# Patient Record
Sex: Female | Born: 1955
Health system: Southern US, Community
[De-identification: ages and names within clinical notes are randomized; demographics above are authoritative.]

## PROBLEM LIST (undated history)

## (undated) DIAGNOSIS — J45909 Unspecified asthma, uncomplicated: Secondary | ICD-10-CM

## (undated) DIAGNOSIS — E119 Type 2 diabetes mellitus without complications: Secondary | ICD-10-CM

## (undated) DIAGNOSIS — E079 Disorder of thyroid, unspecified: Secondary | ICD-10-CM

## (undated) DIAGNOSIS — K819 Cholecystitis, unspecified: Secondary | ICD-10-CM

## (undated) DIAGNOSIS — N200 Calculus of kidney: Secondary | ICD-10-CM

## (undated) DIAGNOSIS — R42 Dizziness and giddiness: Secondary | ICD-10-CM

## (undated) HISTORY — PX: KNEE SURGERY: SHX244

## (undated) HISTORY — PX: CHOLECYSTECTOMY: SHX55

## (undated) HISTORY — PX: ABDOMINAL HYSTERECTOMY: SHX81

---

## 2014-06-11 ENCOUNTER — Encounter (HOSPITAL_BASED_OUTPATIENT_CLINIC_OR_DEPARTMENT_OTHER): Payer: Self-pay

## 2014-06-11 ENCOUNTER — Emergency Department (HOSPITAL_BASED_OUTPATIENT_CLINIC_OR_DEPARTMENT_OTHER)
Admission: EM | Admit: 2014-06-11 | Discharge: 2014-06-11 | Disposition: A | Discharge status: Home / Self Care | Attending: Emergency Medicine | Admitting: Emergency Medicine

## 2014-06-11 ENCOUNTER — Emergency Department (HOSPITAL_BASED_OUTPATIENT_CLINIC_OR_DEPARTMENT_OTHER)

## 2014-06-11 DIAGNOSIS — R1011 Right upper quadrant pain: Secondary | ICD-10-CM | POA: Diagnosis present

## 2014-06-11 DIAGNOSIS — K828 Other specified diseases of gallbladder: Secondary | ICD-10-CM

## 2014-06-11 DIAGNOSIS — R1013 Epigastric pain: Secondary | ICD-10-CM

## 2014-06-11 DIAGNOSIS — E669 Obesity, unspecified: Secondary | ICD-10-CM | POA: Insufficient documentation

## 2014-06-11 DIAGNOSIS — E119 Type 2 diabetes mellitus without complications: Secondary | ICD-10-CM | POA: Diagnosis not present

## 2014-06-11 DIAGNOSIS — R109 Unspecified abdominal pain: Secondary | ICD-10-CM

## 2014-06-11 DIAGNOSIS — E079 Disorder of thyroid, unspecified: Secondary | ICD-10-CM | POA: Diagnosis not present

## 2014-06-11 HISTORY — DX: Type 2 diabetes mellitus without complications: E11.9

## 2014-06-11 HISTORY — DX: Disorder of thyroid, unspecified: E07.9

## 2014-06-11 LAB — URINE MICROSCOPIC-ADD ON

## 2014-06-11 LAB — COMPREHENSIVE METABOLIC PANEL
ALBUMIN: 3.8 g/dL (ref 3.5–5.0)
ALK PHOS: 93 U/L (ref 38–126)
ALT: 24 U/L (ref 14–54)
AST: 22 U/L (ref 15–41)
Anion gap: 9 (ref 5–15)
BUN: 15 mg/dL (ref 6–20)
CALCIUM: 9.1 mg/dL (ref 8.9–10.3)
CHLORIDE: 104 mmol/L (ref 101–111)
CO2: 25 mmol/L (ref 22–32)
CREATININE: 0.67 mg/dL (ref 0.44–1.00)
GFR calc Af Amer: >60 mL/min (ref >60)
GFR calc non Af Amer: >60 mL/min (ref >60)
Glucose, Bld: 157 mg/dL — ABNORMAL HIGH (ref 65–99)
POTASSIUM: 3.3 mmol/L — AB (ref 3.5–5.1)
SODIUM: 138 mmol/L (ref 135–145)
Total Bilirubin: 0.4 mg/dL (ref 0.3–1.2)
Total Protein: 7.5 g/dL (ref 6.5–8.1)

## 2014-06-11 LAB — URINALYSIS, ROUTINE W REFLEX MICROSCOPIC
BILIRUBIN URINE: NEGATIVE
Glucose, UA: NEGATIVE mg/dL
Hgb urine dipstick: NEGATIVE
KETONES UR: NEGATIVE mg/dL
Nitrite: NEGATIVE
PROTEIN: NEGATIVE mg/dL
Specific Gravity, Urine: 1.016 (ref 1.005–1.030)
UROBILINOGEN UA: 0.2 mg/dL (ref 0.0–1.0)
pH: 6 (ref 5.0–8.0)

## 2014-06-11 LAB — LIPASE, BLOOD: LIPASE: 24 U/L (ref 22–51)

## 2014-06-11 LAB — CBC
HEMATOCRIT: 41.7 % (ref 36.0–46.0)
HEMOGLOBIN: 13.8 g/dL (ref 12.0–15.0)
MCH: 27.7 pg (ref 26.0–34.0)
MCHC: 33.1 g/dL (ref 30.0–36.0)
MCV: 83.6 fL (ref 78.0–100.0)
PLATELETS: 295 10*3/uL (ref 150–400)
RBC: 4.99 MIL/uL (ref 3.87–5.11)
RDW: 13.4 % (ref 11.5–15.5)
WBC: 12.1 10*3/uL — ABNORMAL HIGH (ref 4.0–10.5)

## 2014-06-11 MED ORDER — ONDANSETRON 4 MG PO TBDP: ORAL_TABLET | ORAL | Status: DC

## 2014-06-11 MED ORDER — OXYCODONE-ACETAMINOPHEN 5-325 MG PO TABS: 1.0000 | ORAL_TABLET | Freq: Four times a day (QID) | ORAL | Status: AC | PRN

## 2014-06-11 MED ORDER — ONDANSETRON HCL 4 MG/2ML IJ SOLN
4.0000 mg | Freq: Once | INTRAMUSCULAR | Status: AC
  Administered 2014-06-11: 4 mg via INTRAVENOUS
  Filled 2014-06-11: qty 2

## 2014-06-11 MED ORDER — MORPHINE SULFATE 4 MG/ML IJ SOLN
4.0000 mg | Freq: Once | INTRAMUSCULAR | Status: AC
  Administered 2014-06-11: 4 mg via INTRAVENOUS
  Filled 2014-06-11: qty 1

## 2014-06-11 NOTE — ED Notes (Signed)
Patient returns from Ultrasound. 

## 2014-06-11 NOTE — ED Notes (Signed)
MD at bedside. 

## 2014-06-11 NOTE — ED Notes (Addendum)
Pt resting quietly talking with her husband in nad.

## 2014-06-11 NOTE — Discharge Instructions (Signed)
Take Zofran as directed as needed for nausea. Take percocet for severe pain only. No driving or operating heavy machinery while taking percocet. This medication may cause drowsiness. Follow up with the surgeon.  Low-Fat Diet for Pancreatitis or Gallbladder Conditions A low-fat diet can be helpful if you have pancreatitis or a gallbladder condition. With these conditions, your pancreas and gallbladder have trouble digesting fats. A healthy eating plan with less fat will help rest your pancreas and gallbladder and reduce your symptoms. WHAT DO I NEED TO KNOW ABOUT THIS DIET?  Eat a low-fat diet.  Reduce your fat intake to less than 20-30% of your total daily calories. This is less than 50-60 g of fat per day.  Remember that you need some fat in your diet. Ask your dietician what your daily goal should be.  Choose nonfat and low-fat healthy foods. Look for the words "nonfat," "low fat," or "fat free."  As a guide, look on the label and choose foods with less than 3 g of fat per serving. Eat only one serving.  Avoid alcohol.  Do not smoke. If you need help quitting, talk with your health care provider.  Eat small frequent meals instead of three large heavy meals. WHAT FOODS CAN I EAT? Grains Include healthy grains and starches such as potatoes, wheat bread, fiber-rich cereal, and brown rice. Choose whole grain options whenever possible. In adults, whole grains should account for 45-65% of your daily calories.  Fruits and Vegetables Eat plenty of fruits and vegetables. Fresh fruits and vegetables add fiber to your diet. Meats and Other Protein Sources Eat lean meat such as chicken and pork. Trim any fat off of meat before cooking it. Eggs, fish, and beans are other sources of protein. In adults, these foods should account for 10-35% of your daily calories. Dairy Choose low-fat milk and dairy options. Dairy includes fat and protein, as well as calcium.  Fats and Oils Limit high-fat foods  such as fried foods, sweets, baked goods, sugary drinks.  Other Creamy sauces and condiments, such as mayonnaise, can add extra fat. Think about whether or not you need to use them, or use smaller amounts or low fat options. WHAT FOODS ARE NOT RECOMMENDED?  High fat foods, such as:  [employer].  Ice cream.  [place] toast.  Sweet rolls.  Pizza.  Cheese bread.  Foods covered with batter, butter, creamy sauces, or cheese.  Fried foods.  Sugary drinks and desserts.  Foods that cause gas or bloating Document Released: 01/08/2013 Document Reviewed: 01/08/2013 [facility] Patient Information 2015 [place], [place]. This information is not intended to replace advice given to you by your health care provider. Make sure you discuss any questions you have with your health care provider. Cholelithiasis Cholelithiasis (also called gallstones) is a form of gallbladder disease in which gallstones form in your gallbladder. The gallbladder is an organ that stores bile made in the liver, which helps digest fats. Gallstones begin as small crystals and slowly grow into stones. Gallstone pain occurs when the gallbladder spasms and a gallstone is blocking the duct. Pain can also occur when a stone passes out of the duct.  RISK FACTORS  Being female.   Having multiple pregnancies. Health care providers sometimes advise removing diseased gallbladders before future pregnancies.   Being obese.  Eating a diet heavy in fried foods and fat.   Being older than 60 years and increasing age.   Prolonged use of medicines containing female hormones.   Having diabetes mellitus.  Rapidly losing weight.   Having a family history of gallstones (heredity).  SYMPTOMS  Nausea.   Vomiting.  Abdominal pain.   Yellowing of the skin (jaundice).   Sudden pain. It may persist from several minutes to several hours.  Fever.   Tenderness to the touch. In some cases, when gallstones do not move  into the bile duct, people have no pain or symptoms. These are called "silent" gallstones.  TREATMENT Silent gallstones do not need treatment. In severe cases, emergency surgery may be required. Options for treatment include:  Surgery to remove the gallbladder. This is the most common treatment.  Medicines. These do not always work and may take 6-12 months or more to work.  Shock wave treatment (extracorporeal biliary lithotripsy). In this treatment an ultrasound machine sends shock waves to the gallbladder to break gallstones into smaller pieces that can pass into the intestines or be dissolved by medicine. HOME CARE INSTRUCTIONS   Only take over-the-counter or prescription medicines for pain, discomfort, or fever as directed by your health care provider.   Follow a low-fat diet until seen again by your health care provider. Fat causes the gallbladder to contract, which can result in pain.   Follow up with your health care provider as directed. Attacks are almost always recurrent and surgery is usually required for permanent treatment.  SEEK IMMEDIATE MEDICAL CARE IF:   Your pain increases and is not controlled by medicines.   You have a fever or persistent symptoms for more than 2-3 days.   You have a fever and your symptoms suddenly get worse.   You have persistent nausea and vomiting.  MAKE SURE YOU:   Understand these instructions.  Will watch your condition.  Will get help right away if you are not doing well or get worse. Document Released: 12/30/2004 Document Revised: 09/05/2012 Document Reviewed: 06/27/2012 [facility] Patient Information 2015 [place]. This information is not intended to replace advice given to you by your health care provider. Make sure you discuss any questions you have with your health care provider.

## 2014-06-11 NOTE — ED Notes (Addendum)
abd pain, nausea x 3 days-denies v/d-states she was seen at urgent care 2 days ago-felt she had a UTI was advised urine was "clear"-was also tx for ?sciatica with steroid and pain injection

## 2014-06-11 NOTE — ED Provider Notes (Signed)
CSN: [id number]     Arrival date & time 06/11/14  1404 History   First MD Initiated Contact with Patient 06/11/14 1411     Chief Complaint  Patient presents with  . Abdominal Pain     (Consider location/radiation/quality/duration/timing/severity/associated sxs/prior Treatment) HPI Comments: 59 y/o F c/o gradual onset epigastric abdominal pain and nausea x 3 days. Pain is sharp, radiating to both sides of her abdomen, constant, with occasional episodes of more severe pain. Nothing in specific makes the pain come or go. She was seen at urgent care 2 days ago and given a steroid and pain injection with minimal change. Reports she vomited "a little" yesterday and has no appetite. No symptom change with food. Denies fever or CP. Was treated for a UTI in March and April, and at urgent care two days ago was told it was "clear". Denies any urinary symptoms at this time. Hx of abdominal hysterectomy, no other abdominal surgeries.  Patient is a 59 y.o. female presenting with abdominal pain. The history is provided by the patient.  Abdominal Pain Associated symptoms: nausea and vomiting     Past Medical History  Diagnosis Date  . Diabetes mellitus without complication   . Thyroid disease    Past Surgical History  Procedure Laterality Date  . Knee surgery    . Abdominal hysterectomy     No family history on file. History  Substance Use Topics  . Smoking status: Never Smoker   . Smokeless tobacco: Not on file  . Alcohol Use: No   OB History    No data available     Review of Systems  Constitutional: Positive for appetite change.  Gastrointestinal: Positive for nausea, vomiting and abdominal pain.  All other systems reviewed and are negative.     Allergies  Levofloxacin and Zolpidem  Home Medications   Prior to Admission medications   Medication Sig Start Date End Date Taking? Authorizing Provider  Calcium Carbonate-Vitamin D (CALCIUM + D PO) Take by mouth.   Yes Historical  Provider, MD  Levothyroxine Sodium (SYNTHROID PO) Take by mouth.   Yes Historical Provider, MD  METFORMIN HCL PO Take by mouth.   Yes Historical Provider, MD  NABUMETONE PO Take by mouth.   Yes Historical Provider, MD  ondansetron (ZOFRAN ODT) 4 MG disintegrating tablet  ODT q4 hours prn nausea/vomit 06/11/14   Ercole Georg M Tylik Treese, PA-C  oxyCODONE-acetaminophen (PERCOCET) 5-325 MG per tablet Take 1-2 tablets by mouth every 6 (six) hours as needed for severe pain. 06/11/14   Kathrynn Speed, PA-C  TRAMADOL HCL ER PO Take by mouth.   Yes Historical Provider, MD   BP 147/61 mmHg  Pulse 70  Temp(Src) 97.9 F (36.6 C) (Oral)  Resp 18  Ht  (1.626 m)  Wt 223 lb (101.152 kg)  BMI 38.26 kg/m2  SpO2 100% Physical Exam  Constitutional: She is oriented to person, place, and time. She appears well-developed and well-nourished. No distress.  Obese.  HENT:  Head: Normocephalic and atraumatic.  Mouth/Throat: Oropharynx is clear and moist.  Eyes: Conjunctivae and EOM are normal.  Neck: Normal range of motion. Neck supple.  Cardiovascular: Normal rate, regular rhythm and normal heart sounds.   Pulmonary/Chest: Effort normal and breath sounds normal. No respiratory distress.  Abdominal: Normal appearance. There is tenderness in the right upper quadrant, epigastric area and left upper quadrant. There is guarding and positive Murphy's sign. There is no rigidity, no rebound and no CVA tenderness.  No peritoneal signs.  Musculoskeletal: Normal range of motion. She exhibits no edema.  Neurological: She is alert and oriented to person, place, and time. No sensory deficit.  Skin: Skin is warm and dry.  Psychiatric: She has a normal mood and affect. Her behavior is normal.  Nursing note and vitals reviewed.   ED Course  Procedures (including critical care time) Labs Review Labs Reviewed  URINALYSIS, ROUTINE W REFLEX MICROSCOPIC - Abnormal; Notable for the following:    APPearance CLOUDY (*)    Leukocytes,  UA MODERATE (*)    All other components within normal limits  CBC - Abnormal; Notable for the following:    WBC 12.1 (*)    All other components within normal limits  COMPREHENSIVE METABOLIC PANEL - Abnormal; Notable for the following:    Potassium 3.3 (*)    Glucose, Bld 157 (*)    All other components within normal limits  URINE MICROSCOPIC-ADD ON - Abnormal; Notable for the following:    Squamous Epithelial / LPF FEW (*)    Bacteria, UA FEW (*)    All other components within normal limits  LIPASE, BLOOD    Imaging Review [place] Abdomen Complete  06/11/2014   CLINICAL DATA:  Acute epigastric abdominal pain.  EXAM: ULTRASOUND ABDOMEN COMPLETE  COMPARISON:  None.  FINDINGS: This exam is significantly limited due to bowel gas and body habitus.  Gallbladder: Probable sludge is noted within the gallbladder. No gallbladder wall thickening or pericholecystic fluid is noted. No definite stones are noted. Positive sonographic Murphy's sign is noted.  Common bile duct: Diameter: 3 mm which is within normal limits.  Liver: Hepatic parenchyma is coarse and echogenic suggesting diffuse hepatocellular disease. No focal abnormality is noted.  IVC: Not well visualized due to overlying bowel gas.  Pancreas: Visualized portion unremarkable.  Spleen: Appears to be normal in size and contours. Otherwise, not well visualized.  Right Kidney: Length: 12.3 cm. Echogenicity within normal limits. No mass or hydronephrosis visualized.  Left Kidney: Length: 11.4 cm. Echogenicity within normal limits. No mass or hydronephrosis visualized.  Abdominal aorta: No aneurysm visualized.  Other findings: None.  IMPRESSION: Exam is significantly limited due to bowel gas and body habitus. Probable sludge is noted in the gallbladder without cholelithiasis, gallbladder wall thickening or pericholecystic fluid. However, positive sonographic Murphy's sign is noted.  Hepatic parenchyma is coarse and echogenic suggesting diffuse hepatocellular  disease, although no focal abnormality is noted.  No other definite abnormality seen.   Electronically Signed   By: Lupita RaiderJames  Green Jr, M.D.   On: 06/11/2014 15:28     EKG Interpretation None      MDM   Final diagnoses:  Gallbladder sludge  Epigastric abdominal pain  RUQ abdominal pain   NAD. AFVSS. Abdomen soft with no peritoneal signs. Tenderness in RUQ, epigastrium and LUQ with guarding, positive Murphy's sign. Labs, abdominal US pending to evaluate gallbladder.  Leukocytosis of 12.1. Labs without other acute finding. US sig for gallbladder sludge and positive sonographic Murphy's sign. No wall thickening or pericholecystic fluid. No fever. Pain controlled in ED. Repeat abdominal exam with mild RUQ tenderness, significant improvement from initial exam. Pt feels comfortable enough to be d/c home and f/u with gen surgery outpt. Rx percocet and zofran. Stable for d/c. Return precautions given. Patient states understanding of treatment care plan and is agreeable.  Kathrynn SpeedRobyn M Tally Mattox, PA-C 06/11/14 1544  Tilden FossaElizabeth Rees, MD 06/12/14 1057

## 2014-06-11 NOTE — ED Notes (Signed)
Per tech request, pain medication held until after U/S. Pt is aware that she will receive pain medication after U/S.

## 2015-03-04 ENCOUNTER — Emergency Department (HOSPITAL_COMMUNITY)

## 2015-03-04 ENCOUNTER — Emergency Department (HOSPITAL_BASED_OUTPATIENT_CLINIC_OR_DEPARTMENT_OTHER)

## 2015-03-04 ENCOUNTER — Emergency Department (HOSPITAL_BASED_OUTPATIENT_CLINIC_OR_DEPARTMENT_OTHER)
Admission: EM | Admit: 2015-03-04 | Discharge: 2015-03-04 | Disposition: A | Discharge status: Home / Self Care | Attending: Emergency Medicine | Admitting: Emergency Medicine

## 2015-03-04 ENCOUNTER — Encounter (HOSPITAL_BASED_OUTPATIENT_CLINIC_OR_DEPARTMENT_OTHER): Payer: Self-pay | Admitting: *Deleted

## 2015-03-04 DIAGNOSIS — R42 Dizziness and giddiness: Secondary | ICD-10-CM | POA: Insufficient documentation

## 2015-03-04 DIAGNOSIS — R4789 Other speech disturbances: Secondary | ICD-10-CM | POA: Insufficient documentation

## 2015-03-04 DIAGNOSIS — R479 Unspecified speech disturbances: Secondary | ICD-10-CM

## 2015-03-04 DIAGNOSIS — E119 Type 2 diabetes mellitus without complications: Secondary | ICD-10-CM | POA: Diagnosis not present

## 2015-03-04 DIAGNOSIS — R2990 Unspecified symptoms and signs involving the nervous system: Secondary | ICD-10-CM | POA: Diagnosis not present

## 2015-03-04 DIAGNOSIS — R531 Weakness: Secondary | ICD-10-CM | POA: Diagnosis not present

## 2015-03-04 DIAGNOSIS — F447 Conversion disorder with mixed symptom presentation: Secondary | ICD-10-CM | POA: Diagnosis not present

## 2015-03-04 DIAGNOSIS — Z7984 Long term (current) use of oral hypoglycemic drugs: Secondary | ICD-10-CM | POA: Diagnosis not present

## 2015-03-04 LAB — COMPREHENSIVE METABOLIC PANEL
ALBUMIN: 3.8 g/dL (ref 3.5–5.0)
ALT: 32 U/L (ref 14–54)
AST: 35 U/L (ref 15–41)
Alkaline Phosphatase: 99 U/L (ref 38–126)
Anion gap: 9 (ref 5–15)
BILIRUBIN TOTAL: 0.5 mg/dL (ref 0.3–1.2)
BUN: 11 mg/dL (ref 6–20)
CHLORIDE: 103 mmol/L (ref 101–111)
CO2: 27 mmol/L (ref 22–32)
CREATININE: 0.64 mg/dL (ref 0.44–1.00)
Calcium: 9.3 mg/dL (ref 8.9–10.3)
GFR calc Af Amer: >60 mL/min (ref >60)
GFR calc non Af Amer: >60 mL/min (ref >60)
GLUCOSE: 114 mg/dL — AB (ref 65–99)
POTASSIUM: 3.7 mmol/L (ref 3.5–5.1)
Sodium: 139 mmol/L (ref 135–145)
TOTAL PROTEIN: 7.8 g/dL (ref 6.5–8.1)

## 2015-03-04 LAB — DIFFERENTIAL
BASOS ABS: 0 10*3/uL (ref 0.0–0.1)
Basophils Relative: 1 %
EOS ABS: 0.2 10*3/uL (ref 0.0–0.7)
Eosinophils Relative: 2 %
LYMPHS ABS: 3.2 10*3/uL (ref 0.7–4.0)
LYMPHS PCT: 36 %
Monocytes Absolute: 0.8 10*3/uL (ref 0.1–1.0)
Monocytes Relative: 9 %
NEUTROS ABS: 4.5 10*3/uL (ref 1.7–7.7)
NEUTROS PCT: 52 %

## 2015-03-04 LAB — CBC
HCT: 43.5 % (ref 36.0–46.0)
HEMOGLOBIN: 13.9 g/dL (ref 12.0–15.0)
MCH: 27 pg (ref 26.0–34.0)
MCHC: 32 g/dL (ref 30.0–36.0)
MCV: 84.6 fL (ref 78.0–100.0)
Platelets: 285 10*3/uL (ref 150–400)
RBC: 5.14 MIL/uL — AB (ref 3.87–5.11)
RDW: 13.3 % (ref 11.5–15.5)
WBC: 8.7 10*3/uL (ref 4.0–10.5)

## 2015-03-04 LAB — PROTIME-INR
INR: 0.98 (ref 0.00–1.49)
Prothrombin Time: 13.2 seconds (ref 11.6–15.2)

## 2015-03-04 LAB — CBG MONITORING, ED: Glucose-Capillary: 105 mg/dL — ABNORMAL HIGH (ref 65–99)

## 2015-03-04 LAB — TROPONIN I
Troponin I: <0.03 ng/mL (ref <0.031)
Troponin I: <0.03 ng/mL (ref <0.031)

## 2015-03-04 LAB — APTT: APTT: 29 s (ref 24–37)

## 2015-03-04 MED ORDER — MECLIZINE HCL 12.5 MG PO TABS: 12.5000 mg | ORAL_TABLET | Freq: Three times a day (TID) | ORAL | Status: AC | PRN

## 2015-03-04 NOTE — ED Notes (Signed)
Code stroke cancelled per Dr. Manus Gunning

## 2015-03-04 NOTE — ED Notes (Signed)
Patient transferred via EMS to [facility]. Report called to the Charge RN at [facility]. Patient complaining of new onset Headache to her left occipital region of her head.

## 2015-03-04 NOTE — Code Documentation (Signed)
60 year old transferred from [facility] with stroke sx.  On arrival she is alert - speech clear with episodes of slurred speech with poor effort - no weakness noted - smiling and laughing - states she had headache intermittently - no focal neuro deficits noted except speech which she describes as " i think it is going to happen again"  Also states she had sx around 4 pm at home today.  Dr. Lavon Paganini at bedside.  VSS.  CBG 112.   Transported to MRI - negative DWI per Dr. Lelon Huh to RN [facility] in ED.  Code stroke cancelled per Dr. Lavon Paganini.

## 2015-03-04 NOTE — ED Notes (Addendum)
Pt amb to triage room 1, resp rapid and shallow, encouraged to slow her resp. reports dizzy x 30 minutes, states she is having trouble speaking, speech is clear after pt encouraged to slow her respirations. Pt states "I'm scared I'm having a stroke!" smile is symetrical, moe + x 4 ext. At triage.

## 2015-03-04 NOTE — ED Notes (Signed)
Per EMS - initially slurring speech at [facility]. Pt had no slurring or deficits en route to [facility]. Negative stroke scale en route. Pt states "I can tell when the slurred speech is coming back again."

## 2015-03-04 NOTE — Discharge Instructions (Signed)
Dizziness Your testing is negative for stroke. Follow up with the neurologist. Return to the ED if you develop new or worsening symptoms. Dizziness is a common problem. It is a feeling of unsteadiness or light-headedness. You may feel like you are about to faint. Dizziness can lead to injury if you stumble or fall. Anyone can become dizzy, but dizziness is more common in older adults. This condition can be caused by a number of things, including medicines, dehydration, or illness. HOME CARE INSTRUCTIONS Taking these steps may help with your condition: Eating and Drinking  Drink enough fluid to keep your urine clear or pale yellow. This helps to keep you from becoming dehydrated. Try to drink more clear fluids, such as water.  Do not drink alcohol.  Limit your caffeine intake if directed by your health care provider.  Limit your salt intake if directed by your health care provider. Activity  Avoid making quick movements.  Rise slowly from chairs and steady yourself until you feel okay.  In the morning, first sit up on the side of the bed. When you feel okay, stand slowly while you hold onto something until you know that your balance is fine.  Move your legs often if you need to stand in one place for a long time. Tighten and relax your muscles in your legs while you are standing.  Do not drive or operate heavy machinery if you feel dizzy.  Avoid bending down if you feel dizzy. Place items in your home so that they are easy for you to reach without leaning over. Lifestyle  Do not use any tobacco products, including cigarettes, chewing tobacco, or electronic cigarettes. If you need help quitting, ask your health care provider.  Try to reduce your stress level, such as with yoga or meditation. Talk with your health care provider if you need help. General Instructions  Watch your dizziness for any changes.  Take medicines only as directed by your health care provider. Talk with your  health care provider if you think that your dizziness is caused by a medicine that you are taking.  Tell a friend or a family member that you are feeling dizzy. If he or she notices any changes in your behavior, have this person call your health care provider.  Keep all follow-up visits as directed by your health care provider. This is important. SEEK MEDICAL CARE IF:  Your dizziness does not go away.  Your dizziness or light-headedness gets worse.  You feel nauseous.  You have reduced hearing.  You have new symptoms.  You are unsteady on your feet or you feel like the room is spinning. SEEK IMMEDIATE MEDICAL CARE IF:  You vomit or have diarrhea and are unable to eat or drink anything.  You have problems talking, walking, swallowing, or using your arms, hands, or legs.  You feel generally weak.  You are not thinking clearly or you have trouble forming sentences. It may take a friend or family member to notice this.  You have chest pain, abdominal pain, shortness of breath, or sweating.  Your vision changes.  You notice any bleeding.  You have a headache.  You have neck pain or a stiff neck.  You have a fever.   This information is not intended to replace advice given to you by your health care provider. Make sure you discuss any questions you have with your health care provider.   Document Released: 06/29/2000 Document Revised: 05/20/2014 Document Reviewed: 12/30/2013 Elsevier Interactive Patient Education  2016 [employer] ° °

## 2015-03-04 NOTE — ED Notes (Signed)
Pt arrives by [facility]. Intermittent slurring of speech and difficulty forming/communicating thoughts. Mild left-sided arm and leg weakness initially, pt now able to lift bilateral arms and legs w/o difficulty.

## 2015-03-04 NOTE — ED Notes (Addendum)
Pt ambulated in room without difficulty. States that she walked to the bathroom recently and had no difficulties. Pt drinking water without difficulty.

## 2015-03-04 NOTE — ED Provider Notes (Signed)
CSN: [id number]     Arrival date & time 03/04/15  1741 History   First MD Initiated Contact with Patient 03/04/15 1758     Chief Complaint  Patient presents with  . Code Stroke  . Dizziness    @ (Consider location/radiation/quality/duration/timing/severity/associated sxs/prior Treatment) HPI Patient presents with acute onset room spinning sensation associated with nausea starting at 5:15. Patient states she laid down and see if the symptoms would resolve. She noted that every time she moved the dizziness worsened. She then began having difficulty speaking. Brought into the emergency department by her husband. She is now complaining of left-sided weakness and numbness. This is started such she arrived in the emergency department. No previous history of stroke. Denies headache, chest pain, abdominal pain. No recent upper respiratory infection or illness Past Medical History  Diagnosis Date  . Diabetes mellitus without complication (HCC)   . Thyroid disease    Past Surgical History  Procedure Laterality Date  . Knee surgery    . Abdominal hysterectomy     History reviewed. No pertinent family history. Social History  Substance Use Topics  . Smoking status: Never Smoker   . Smokeless tobacco: None  . Alcohol Use: No   OB History    No data available     Review of Systems  Constitutional: Negative for fever and chills.  HENT: Negative for congestion, ear pain and sinus pressure.   Eyes: Negative for visual disturbance.  Respiratory: Negative for cough and shortness of breath.   Cardiovascular: Negative for chest pain.  Gastrointestinal: Positive for nausea. Negative for vomiting and abdominal pain.  Musculoskeletal: Positive for gait problem. Negative for myalgias, back pain, neck pain and neck stiffness.  Skin: Negative for rash and wound.  Neurological: Positive for dizziness, speech difficulty, weakness, light-headedness and numbness. Negative for headaches.  All  other systems reviewed and are negative.     Allergies  Levofloxacin and Zolpidem  Home Medications   Prior to Admission medications   Medication Sig Start Date End Date Taking? Authorizing Provider  Calcium Carbonate-Vitamin D (CALCIUM + D PO) Take by mouth.    Historical Provider, MD  Levothyroxine Sodium (SYNTHROID PO) Take by mouth.    Historical Provider, MD  METFORMIN HCL PO Take by mouth.    Historical Provider, MD  NABUMETONE PO Take by mouth.    Historical Provider, MD  ondansetron (ZOFRAN ODT) 4 MG disintegrating tablet  ODT q4 hours prn nausea/vomit 06/11/14   Robyn M Hess, PA-C  oxyCODONE-acetaminophen (PERCOCET) 5-325 MG per tablet Take 1-2 tablets by mouth every 6 (six) hours as needed for severe pain. 06/11/14   Kathrynn Speed, PA-C  TRAMADOL HCL ER PO Take by mouth.    Historical Provider, MD   BP 168/88 mmHg  Pulse 83  Temp(Src) 97.8 F (36.6 C) (Oral)  Resp 13  SpO2 99% Physical Exam  Constitutional: She is oriented to person, place, and time. She appears well-developed and well-nourished. No distress.  Anxious appearing  HENT:  Head: Normocephalic and atraumatic.  Mouth/Throat: Oropharynx is clear and moist. No oropharyngeal exudate.  Eyes: EOM are normal. Pupils are equal, round, and reactive to light.  Fatigable rotatory nystagmus worsened with position change  Neck: Normal range of motion. Neck supple.  Cardiovascular: Normal rate and regular rhythm.  Exam reveals no gallop and no friction rub.   No murmur heard. Pulmonary/Chest: Effort normal and breath sounds normal. No respiratory distress. She has no wheezes. She has no rales. She exhibits  no tenderness.  Abdominal: Soft. Bowel sounds are normal. She exhibits no distension. There is no tenderness. There is no rebound and no guarding.  Musculoskeletal: Normal range of motion. She exhibits no edema or tenderness.  Lymphadenopathy:    She has no cervical adenopathy.  Neurological: She is alert and  oriented to person, place, and time.  Patient with inconsistent expressive aphasia. No facial asymmetry. Patient states she is having decreased sensation to the left side of upper and lower face. She also states she's having decreased sensation left upper and lower extremity. 4/5 motor in left upper extremity. 3/5 motor in left lower extremity. This appears to be effort dependent. 5/5 motor and right upper and right lower extremities.  Skin: Skin is warm and dry. No rash noted. No erythema.  Nursing note and vitals reviewed.   ED Course  Procedures (including critical care time) Labs Review Labs Reviewed  CBC - Abnormal; Notable for the following:    RBC 5.14 (*)    All other components within normal limits  CBG MONITORING, ED - Abnormal; Notable for the following:    Glucose-Capillary 105 (*)    All other components within normal limits  PROTIME-INR  APTT  DIFFERENTIAL  COMPREHENSIVE METABOLIC PANEL  TROPONIN I  CBG MONITORING, ED    Imaging Review Ct Head Wo Contrast  03/04/2015  CLINICAL DATA:  60 year old female with aphasia and dizziness. EXAM: CT HEAD WITHOUT CONTRAST TECHNIQUE: Contiguous axial images were obtained from the base of the skull through the vertex without intravenous contrast. COMPARISON:  None. FINDINGS: No acute intracranial hemorrhage. The ventricles and sulci are appropriate in size for patient's age. Minimal periventricular and white matter hypodensities represent chronic microvascular ischemic changes. Stop there is no mass effect or midline shift. An empty sella is incidentally noted. The visualized paranasal sinuses and mastoid air cells are well aerated. The calvarium is intact. IMPRESSION: No acute intracranial pathology. Electronically Signed   By: Elgie Collard M.D.   On: 03/04/2015 18:21   I have personally reviewed and evaluated these images and lab results as part of my medical decision-making.   EKG Interpretation   Date/Time:  Wednesday  March 04 2015 17:56:14 EST Ventricular Rate:  80 PR Interval:  136 QRS Duration: 85 QT Interval:  379 QTC Calculation: 437 R Axis:   31 Text Interpretation:  Sinus rhythm Abnormal R-wave progression, early  transition Confirmed by Ranae Palms  MD, Aydn Ferrara ([id number]) on 03/04/2015 6:28:07  PM      MDM   Final diagnoses:  Vertigo  Spell of change in speech  Left-sided weakness    Discussed with neurology on call. Will see patient in the [facility] emergency department. Dr. Anitra Lauth will accept patient. Patient had CT head performed in the emergency department. No acute intracranial bleed identified.    Loren Racer, MD 03/04/15 [phone]

## 2015-03-04 NOTE — ED Notes (Signed)
Rapid Response RN went with patient to MRI

## 2015-03-04 NOTE — ED Notes (Signed)
Patient transported to CT with RN 

## 2015-03-04 NOTE — Consult Note (Signed)
Requesting Physician: Dr. Manus Gunning     Reason for consultation: to evaluate for acute stroke, left-sided weakness  HPI:                                                                                                                                         Denise Gallagher is an 60 y.o. female patient who presented  to the [facility] ER with symptoms of vertigo, headache neck pain and left-sided weakness. Due to neurological symptoms, an acute stroke was suspected and she was urgently transferred to [facility] ER for further evaluation. A stroke code was called and when she came into the emergency room .   patient reports that her symptoms have been fluctuating and now after she presented to the ER for left-sided weakness symptoms have resolved. Her headache and neck pain also been improving. She intermittently have some stuttering speech. Denies prior head trauma or prior strokes. History of diabetes and hypothyroidism. He is not taking aspirin consistently  Date last known well:  03/04/15 Time last known well:  1700 tPA Given: No: Nonfocal examination , suspect conversion disorder, negative MRI for acute stroke   Stroke Risk Factors - diabetes mellitus  Past Medical History: Past Medical History  Diagnosis Date  . Diabetes mellitus without complication (HCC)   . Thyroid disease     Past Surgical History  Procedure Laterality Date  . Knee surgery    . Abdominal hysterectomy      Family History: History reviewed. No pertinent family history.  Social History:   reports that she has never smoked. She does not have any smokeless tobacco history on file. She reports that she does not drink alcohol or use illicit drugs.  Allergies:  Allergies  Allergen Reactions  . Levofloxacin   . Zolpidem      Medications:                                                                                                                        No current facility-administered medications for this  encounter.  Current outpatient prescriptions:  .  Calcium Carbonate-Vitamin D (CALCIUM + D PO), Take by mouth., Disp: , Rfl:  .  Levothyroxine Sodium (SYNTHROID PO), Take by mouth., Disp: , Rfl:  .  METFORMIN HCL PO, Take by mouth., Disp: , Rfl:  .  NABUMETONE PO, Take by mouth., Disp: , Rfl:  .  ondansetron (ZOFRAN ODT) 4 MG disintegrating tablet,  ODT q4 hours prn nausea/vomit, Disp: 10 tablet, Rfl: 0 .  oxyCODONE-acetaminophen (PERCOCET) 5-325 MG per tablet, Take 1-2 tablets by mouth every 6 (six) hours as needed for severe pain., Disp: 30 tablet, Rfl: 0 .  TRAMADOL HCL ER PO, Take by mouth., Disp: , Rfl:    ROS:                                                                                                                                       History obtained from the patient  General ROS: negative for - chills, fatigue, fever, night sweats, weight gain or weight loss Psychological ROS: negative for - behavioral disorder, hallucinations, memory difficulties, mood swings or suicidal ideation Ophthalmic ROS: negative for - blurry vision, double vision, eye pain or loss of vision ENT ROS: negative for - epistaxis, nasal discharge, oral lesions, sore throat, tinnitus or vertigo Allergy and Immunology ROS: negative for - hives or itchy/watery eyes Hematological and Lymphatic ROS: negative for - bleeding problems, bruising or swollen lymph nodes Endocrine ROS: negative for - galactorrhea, hair pattern changes, polydipsia/polyuria or temperature intolerance Respiratory ROS: negative for - cough, hemoptysis, shortness of breath or wheezing Cardiovascular ROS: negative for - chest pain, dyspnea on exertion, edema or irregular heartbeat Gastrointestinal ROS: negative for - abdominal pain, diarrhea, hematemesis, nausea/vomiting or stool incontinence Genito-Urinary ROS: negative for - dysuria, hematuria, incontinence or urinary frequency/urgency Musculoskeletal ROS: negative for - joint  swelling or muscular weakness Neurological ROS: as noted in HPI Dermatological ROS: negative for rash and skin lesion changes  Neurologic Examination:                                                                                                      Blood pressure 168/88, pulse 83, temperature 97.8 F (36.6 C), temperature source Oral, resp. rate 13, SpO2 99 %.  Evaluation of higher integrative functions including: Level of alertness: Alert,  not in acute distress  Oriented to time, place and person Speech: fluent, no evidence of dysarthria or aphasia noted.  she had some intermittent stuttering speech which appears psychogenic, but mostly fluent speech Test the following cranial nerves: 2-12 grossly intact Motor examination: Normal tone, bulk, full 5/5 motor strength in all 4 extremities. Initially had some giveaway on the left side which improved to full strength on encouragement  Examination of sensation : Normal and symmetric sensation to pinprick in all 4 extremities and on face. She initially  reported reduced sensation on the left side but with repeated testing, she reported symmetric sensation bilaterally .  Examination of deep tendon reflexes: 2+, normal and symmetric in all extremities, normal plantars bilaterally Test coordination: Normal finger nose testing, with no evidence of limb appendicular ataxia or abnormal involuntary movements or tremors noted.  Gait: Deferred   Lab Results: Basic Metabolic Panel:  Recent Labs Lab 03/04/15 1800  NA 139  K 3.7  CL 103  CO2 27  GLUCOSE 114*  BUN 11  CREATININE 0.64  CALCIUM 9.3    Liver Function Tests:  Recent Labs Lab 03/04/15 1800  AST 35  ALT 32  ALKPHOS 99  BILITOT 0.5  PROT 7.8  ALBUMIN 3.8   No results for input(s): LIPASE, AMYLASE in the last 168 hours. No results for input(s): AMMONIA in the last 168 hours.  CBC:  Recent Labs Lab 03/04/15 1800  WBC 8.7  NEUTROABS 4.5  HGB 13.9  HCT 43.5  MCV 84.6   PLT 285    Cardiac Enzymes:  Recent Labs Lab 03/04/15 1800  TROPONINI <0.03    Lipid Panel: No results for input(s): CHOL, TRIG, HDL, CHOLHDL, VLDL, LDLCALC in the last 168 hours.  CBG:  Recent Labs Lab 03/04/15 1745  GLUCAP 105*    Microbiology: No results found for this or any previous visit.   Imaging: Ct Head Wo Contrast  03/04/2015  CLINICAL DATA:  60 year old female with aphasia and dizziness. EXAM: CT HEAD WITHOUT CONTRAST TECHNIQUE: Contiguous axial images were obtained from the base of the skull through the vertex without intravenous contrast. COMPARISON:  None. FINDINGS: No acute intracranial hemorrhage. The ventricles and sulci are appropriate in size for patient's age. Minimal periventricular and white matter hypodensities represent chronic microvascular ischemic changes. Stop there is no mass effect or midline shift. An empty sella is incidentally noted. The visualized paranasal sinuses and mastoid air cells are well aerated. The calvarium is intact. IMPRESSION: No acute intracranial pathology. Electronically Signed   By: Elgie Collard M.D.   On: 03/04/2015 18:21    Assessment and plan:   Waverley Krempasky is an 60 y.o. female patient who was brought into the [facility] ER for further evaluation of acute stroke like symptoms. Neurological examination is suggestive for possible conversion disorder with no definite focal neurological deficits noted on exam. Hence stat MRI was done to rule out an acute stroke given the suspicion for conversion syndrome. The MRI study showed no acute stroke already W sequence. At the time of completing this note, other sequences were not completed and I haven't reviewed them.  Since her clinical examination is nonfocal, with negative DWI MRI sequence, her presentation is not consistent with an acute stroke. Likely conversion disorder.  No further acute neurodiagnostic workup recommended at this time from neurological standpoint. Supportive care  and medical management deferred to the ER physician.   Discussed with ER physician, Dr. Manus Gunning.

## 2015-03-04 NOTE — ED Provider Notes (Signed)
Patient transferred from outside facility with room spinning sensation. She also has difficulty getting her words out. Reported to have left-sided weakness and numbness at the outside facility.  She is now able to lift her bilateral arms and legs without difficulty. She has slurred speech that comes and goes and some stuttering.  Seen with neurology on arrival. Anxious appearing, stuttering, intermittent, expressive aphasia. CN 2-12 intact. Moving all extremities.  MRI Negative for stroke or other acute abnormality. Tolerating PO and ambulatory.  No further neurological workup recommended per Dr. Lazaro Arms, MD 03/04/15 [id number]

## 2015-12-05 ENCOUNTER — Emergency Department (HOSPITAL_BASED_OUTPATIENT_CLINIC_OR_DEPARTMENT_OTHER)

## 2015-12-05 ENCOUNTER — Encounter (HOSPITAL_BASED_OUTPATIENT_CLINIC_OR_DEPARTMENT_OTHER): Payer: Self-pay | Admitting: Emergency Medicine

## 2015-12-05 ENCOUNTER — Emergency Department (HOSPITAL_BASED_OUTPATIENT_CLINIC_OR_DEPARTMENT_OTHER)
Admission: EM | Admit: 2015-12-05 | Discharge: 2015-12-05 | Disposition: A | Discharge status: Home / Self Care | Attending: Emergency Medicine | Admitting: Emergency Medicine

## 2015-12-05 DIAGNOSIS — Z7984 Long term (current) use of oral hypoglycemic drugs: Secondary | ICD-10-CM | POA: Insufficient documentation

## 2015-12-05 DIAGNOSIS — Z79899 Other long term (current) drug therapy: Secondary | ICD-10-CM | POA: Diagnosis not present

## 2015-12-05 DIAGNOSIS — E119 Type 2 diabetes mellitus without complications: Secondary | ICD-10-CM | POA: Diagnosis not present

## 2015-12-05 DIAGNOSIS — B9789 Other viral agents as the cause of diseases classified elsewhere: Secondary | ICD-10-CM

## 2015-12-05 DIAGNOSIS — J069 Acute upper respiratory infection, unspecified: Secondary | ICD-10-CM | POA: Diagnosis not present

## 2015-12-05 DIAGNOSIS — R05 Cough: Secondary | ICD-10-CM | POA: Diagnosis present

## 2015-12-05 DIAGNOSIS — J45909 Unspecified asthma, uncomplicated: Secondary | ICD-10-CM | POA: Insufficient documentation

## 2015-12-05 DIAGNOSIS — R1110 Vomiting, unspecified: Secondary | ICD-10-CM | POA: Insufficient documentation

## 2015-12-05 HISTORY — DX: Calculus of kidney: N20.0

## 2015-12-05 HISTORY — DX: Cholecystitis, unspecified: K81.9

## 2015-12-05 HISTORY — DX: Unspecified asthma, uncomplicated: J45.909

## 2015-12-05 MED ORDER — OXYMETAZOLINE HCL 0.05 % NA SOLN
1.0000 | Freq: Two times a day (BID) | NASAL | Status: DC | PRN
  Administered 2015-12-05: 1 via NASAL
  Filled 2015-12-05: qty 15

## 2015-12-05 MED ORDER — BENZONATATE 100 MG PO CAPS: 100.0000 mg | ORAL_CAPSULE | Freq: Three times a day (TID) | ORAL | 0 refills | Status: DC

## 2015-12-05 NOTE — ED Notes (Signed)
States has had sinus drainage and pain to face since Thursday, with coughing green mucus.

## 2015-12-05 NOTE — ED Triage Notes (Signed)
Pt c/o productive cough with green sputum since Wednesday along with sore throat that started yesterday.

## 2015-12-05 NOTE — ED Provider Notes (Signed)
[facility] Provider Note   CSN: [id number] Arrival date & time: 12/05/15  1339  By signing my name below, I, Denise Gallagher, attest that this documentation has been prepared under the direction and in the presence of Rolan BuccoMelanie Thai Burgueno, MD . Electronically Signed: Modena JanskyAlbert Gallagher, Scribe. 12/05/2015. 3:37 PM.  History   Chief Complaint Chief Complaint  Patient presents with  . Cough   The history is provided by the patient. No language interpreter was used.   HPI Comments: Denise Gallagher is a 60 y.o. female with a hx of asthma who presents to the Emergency Department complaining of intermittent moderate cough that started about 3 days ago. She states she has been having URI-like symptoms that have been gradually worsening. She reports associated symptoms of nasal congestion, sinus pressure, subjective fever, and post-tussive vomiting. Pt's temperature in the ED today was 98.3. She has been taking mucinex for her symptoms with minimal relief. She denies any decreased fluid intake, rash, or abdominal pain.   PCP: Dr. Lafayette Dragonarr  Past Medical History:  Diagnosis Date  . Asthma   . Diabetes mellitus without complication (HCC)   . Gall bladder inflammation   . Kidney stone   . Thyroid disease     There are no active problems to display for this patient.   Past Surgical History:  Procedure Laterality Date  . ABDOMINAL HYSTERECTOMY    . KNEE SURGERY      OB History    No data available       Home Medications    Prior to Admission medications   Medication Sig Start Date End Date Taking? Authorizing Provider  Ascorbic Acid (VITAMIN C CR) 500 MG CPCR Take 500 mg by mouth daily. 03/15/13  Yes Historical Provider, MD  Calcium Citrate-Vitamin D (CALCIUM + D PO) Take 1 tablet by mouth 4 (four) times a week.   Yes Historical Provider, MD  Cholecalciferol (D 2000) 2000 units TABS Take 2,000 Units by mouth daily. 03/15/13  Yes Historical Provider, MD  levothyroxine (SYNTHROID,  LEVOTHROID) 25 MCG tablet Take 25 mcg by mouth daily before breakfast. 01/07/15  Yes Historical Provider, MD  metFORMIN (GLUCOPHAGE-XR) 500 MG 24 hr tablet Take 500 mg by mouth 2 (two) times daily. 01/07/15  Yes Historical Provider, MD  PROAIR HFA 108 (90 Base) MCG/ACT inhaler Inhale 2 puffs into the lungs daily as needed for shortness of breath.  01/18/15  Yes Historical Provider, MD  traMADol (ULTRAM) 50 MG tablet Take 50 mg by mouth every morning. For cough 02/17/15  Yes Historical Provider, MD  benzonatate (TESSALON) 100 MG capsule Take 1 capsule (100 mg total) by mouth every 8 (eight) hours. 12/05/15   Rolan BuccoMelanie Keyshawna Prouse, MD  meclizine (ANTIVERT) 12.5 MG tablet Take 1 tablet (12.5 mg total) by mouth 3 (three) times daily as needed for dizziness. 03/04/15   Glynn OctaveStephen Rancour, MD  omeprazole (PRILOSEC) 40 MG capsule Take 40 mg by mouth daily. 11/27/14   Historical Provider, MD  ondansetron (ZOFRAN ODT) 4 MG disintegrating tablet 4mg  ODT q4 hours prn nausea/vomit 06/11/14   Robyn M Hess, PA-C  oxyCODONE-acetaminophen (PERCOCET) 5-325 MG per tablet Take 1-2 tablets by mouth every 6 (six) hours as needed for severe pain. 06/11/14   Kathrynn Speedobyn M Hess, PA-C  tamsulosin (FLOMAX) 0.4 MG CAPS capsule Take 0.4 mg by mouth daily as needed (for urine flow).  12/25/14   Historical Provider, MD    Family History No family history on file.  Social History Social History  Substance  Use Topics  . Smoking status: Never Smoker  . Smokeless tobacco: Never Used  . Alcohol use No     Allergies   Levofloxacin and Zolpidem tartrate   Review of Systems Review of Systems  Constitutional: Positive for fever (Subjective). Negative for chills, diaphoresis and fatigue.  HENT: Positive for congestion (Nasal ) and sinus pressure. Negative for rhinorrhea and sneezing.   Eyes: Negative.   Respiratory: Positive for cough. Negative for chest tightness and shortness of breath.   Cardiovascular: Negative for chest pain and leg  swelling.  Gastrointestinal: Positive for vomiting (Post-tussive). Negative for abdominal pain, blood in stool, diarrhea and nausea.  Genitourinary: Negative for difficulty urinating, flank pain, frequency and hematuria.  Musculoskeletal: Negative for arthralgias and back pain.  Skin: Negative for rash.  Neurological: Negative for dizziness, speech difficulty, weakness, numbness and headaches.     Physical Exam Updated Vital Signs BP 131/70 (BP Location: Right Arm)   Pulse 93   Temp 98.3 F (36.8 C) (Oral)   Resp 20   Ht 5\' 4"  (1.626 m)   Wt 218 lb (98.9 kg)   SpO2 99%   BMI 37.42 kg/m   Physical Exam  Constitutional: She is oriented to person, place, and time. She appears well-developed and well-nourished.  HENT:  Head: Normocephalic and atraumatic.  Right Ear: Tympanic membrane normal.  Left Ear: Tympanic membrane normal.  Mouth/Throat: Uvula is midline and oropharynx is clear and moist. No oropharyngeal exudate, posterior oropharyngeal edema, posterior oropharyngeal erythema or tonsillar abscesses.  Eyes: Pupils are equal, round, and reactive to light.  Neck: Normal range of motion. Neck supple.  Cardiovascular: Normal rate, regular rhythm and normal heart sounds.   Pulmonary/Chest: Effort normal and breath sounds normal. No respiratory distress. She has no wheezes. She has no rales. She exhibits no tenderness.  Abdominal: Soft. Bowel sounds are normal. There is no tenderness. There is no rebound and no guarding.  Musculoskeletal: Normal range of motion. She exhibits no edema.  Lymphadenopathy:    She has no cervical adenopathy.  Neurological: She is alert and oriented to person, place, and time.  Skin: Skin is warm and dry. No rash noted.  Psychiatric: She has a normal mood and affect.  Nursing note and vitals reviewed.    ED Treatments / Results  DIAGNOSTIC STUDIES: Oxygen Saturation is 99% on RA, normal by my interpretation.    COORDINATION OF CARE: 3:41 PM- Pt  advised of plan for treatment and pt agrees.  Labs (all labs ordered are listed, but only abnormal results are displayed) Labs Reviewed - No data to display  EKG  EKG Interpretation None       Radiology Dg Chest 2 View  Result Date: 12/05/2015 CLINICAL DATA:  Cough for 4 days EXAM: CHEST  2 VIEW COMPARISON:  None. FINDINGS: Lungs are clear. Heart size and pulmonary vascularity are normal. No adenopathy. There is mild degenerative change in the thoracic spine. IMPRESSION: No edema or consolidation. Electronically Signed   By: Bretta Bang III M.D.   On: 12/05/2015 15:25    Procedures Procedures (including critical care time)  Medications Ordered in ED Medications  oxymetazoline (AFRIN) 0.05 % nasal spray 1 spray (not administered)     Initial Impression / Assessment and Plan / ED Course  I have reviewed the triage vital signs and the nursing notes.  Pertinent labs & imaging results that were available during my care of the patient were reviewed by me and considered in my medical decision making (  see chart for details).  Clinical Course     Patient presents with cough and upper respiratory symptoms. Given the symptoms have only been going on for 3 days and she's otherwise well-appearing, I don't suspect a bacterial sinus infection. There is no evidence of pneumonia. No other evidence of bacterial infection. She was discharged home in good condition. She is currently taking a codeine-based cough syrup but she says she doesn't like the way it makes her feel. I gave her a prescription for Tessalon Perles and she was also dispensed Afrin to use for the sinus congestion. She will continue Mucinex as well. She was advised only to use the Afrin for 3 days and discontinue it after that. She was advised to follow[place] if her symptoms are not improving or return here as needed for any worsening symptoms.  Final Clinical Impressions(s) / ED Diagnoses     Final diagnoses:  Viral URI with cough    New Prescriptions New Prescriptions   BENZONATATE (TESSALON) 100 MG CAPSULE    Take 1 capsule (100 mg total) by mouth every 8 (eight) hours.   I personally performed the services described in this documentation, which was scribed in my presence.  The recorded information has been reviewed and considered.     Rolan BuccoMelanie Harjit Douds, MD 12/05/15 [phone]

## 2015-12-05 NOTE — ED Notes (Signed)
Patient denies pain and is resting comfortably.  

## 2015-12-08 ENCOUNTER — Encounter (HOSPITAL_BASED_OUTPATIENT_CLINIC_OR_DEPARTMENT_OTHER): Payer: Self-pay | Admitting: Emergency Medicine

## 2015-12-08 ENCOUNTER — Emergency Department (HOSPITAL_BASED_OUTPATIENT_CLINIC_OR_DEPARTMENT_OTHER)

## 2015-12-08 ENCOUNTER — Emergency Department (HOSPITAL_BASED_OUTPATIENT_CLINIC_OR_DEPARTMENT_OTHER)
Admission: EM | Admit: 2015-12-08 | Discharge: 2015-12-08 | Disposition: A | Discharge status: Home / Self Care | Attending: Emergency Medicine | Admitting: Emergency Medicine

## 2015-12-08 DIAGNOSIS — E119 Type 2 diabetes mellitus without complications: Secondary | ICD-10-CM | POA: Insufficient documentation

## 2015-12-08 DIAGNOSIS — Y999 Unspecified external cause status: Secondary | ICD-10-CM | POA: Diagnosis not present

## 2015-12-08 DIAGNOSIS — S6992XA Unspecified injury of left wrist, hand and finger(s), initial encounter: Secondary | ICD-10-CM | POA: Diagnosis present

## 2015-12-08 DIAGNOSIS — M25562 Pain in left knee: Secondary | ICD-10-CM | POA: Insufficient documentation

## 2015-12-08 DIAGNOSIS — Y9301 Activity, walking, marching and hiking: Secondary | ICD-10-CM | POA: Insufficient documentation

## 2015-12-08 DIAGNOSIS — J45909 Unspecified asthma, uncomplicated: Secondary | ICD-10-CM | POA: Diagnosis not present

## 2015-12-08 DIAGNOSIS — Z79899 Other long term (current) drug therapy: Secondary | ICD-10-CM | POA: Insufficient documentation

## 2015-12-08 DIAGNOSIS — Y929 Unspecified place or not applicable: Secondary | ICD-10-CM | POA: Diagnosis not present

## 2015-12-08 DIAGNOSIS — Z7984 Long term (current) use of oral hypoglycemic drugs: Secondary | ICD-10-CM | POA: Insufficient documentation

## 2015-12-08 DIAGNOSIS — W1839XA Other fall on same level, initial encounter: Secondary | ICD-10-CM | POA: Diagnosis not present

## 2015-12-08 DIAGNOSIS — S6292XA Unspecified fracture of left wrist and hand, initial encounter for closed fracture: Secondary | ICD-10-CM

## 2015-12-08 DIAGNOSIS — S62617A Displaced fracture of proximal phalanx of left little finger, initial encounter for closed fracture: Secondary | ICD-10-CM | POA: Diagnosis not present

## 2015-12-08 DIAGNOSIS — W19XXXA Unspecified fall, initial encounter: Secondary | ICD-10-CM

## 2015-12-08 HISTORY — DX: Dizziness and giddiness: R42

## 2015-12-08 MED ORDER — HYDROCODONE-ACETAMINOPHEN 5-325 MG PO TABS: 2.0000 | ORAL_TABLET | ORAL | 0 refills | Status: AC | PRN

## 2015-12-08 MED ORDER — HYDROCODONE-ACETAMINOPHEN 5-325 MG PO TABS
1.0000 | ORAL_TABLET | Freq: Once | ORAL | Status: AC
  Administered 2015-12-08: 1 via ORAL
  Filled 2015-12-08: qty 1

## 2015-12-08 MED ORDER — DOCUSATE SODIUM 100 MG PO CAPS: 100.0000 mg | ORAL_CAPSULE | Freq: Two times a day (BID) | ORAL | 0 refills | Status: AC

## 2015-12-08 NOTE — Discharge Instructions (Signed)
You were seen in the ED today with a hand fracture. We are placing you in a splint that you will need to keep clean and dry. Take pain medication as needed but do not drive while taking this medication.   Follow up with the  orthopedic surgeon listed below for outpatient follow up in the next week as this type of fracture sometimes requires surgery.

## 2015-12-08 NOTE — ED Provider Notes (Signed)
Emergency Department Provider Note   I have reviewed the triage vital signs and the nursing notes.   HISTORY  Chief Complaint Fall   HPI Denise Gallagher is a 60 y.o. female with PMH of DM and thyroid disease presents to the emergency department for evaluation of left hand pain and knee pain after mechanical fall. Patient states there is an irregularity in the sidewalk and she tripped over this falling forward. Her left hand became trapped underneath her as she hit the ground. She reports scraping her face on the sidewalk. She did not lose consciousness. She is not having any pain in her neck. No presyncope/syncope symptoms prior to fall. She is having the most pain and swelling over her left lateral hand. The patient is right hand dominant.    Past Medical History:  Diagnosis Date  . Asthma   . Diabetes mellitus without complication (HCC)   . Gall bladder inflammation   . Kidney stone   . Thyroid disease   . Vertigo     There are no active problems to display for this patient.   Past Surgical History:  Procedure Laterality Date  . ABDOMINAL HYSTERECTOMY    . KNEE SURGERY Bilateral     Current Outpatient Rx  . Order #: [id number] Class: Historical Med  . Order #: [id number] Class: Print  . Order #: [id number] Class: Historical Med  . Order #: [id number] Class: Historical Med  . Order #: [id number] Class: Historical Med  . Order #: [id number] Class: Historical Med  . Order #: [id number] Class: Historical Med  . Order #: [id number] Class: Historical Med  . Order #: [id number] Class: Print  . Order #: [id number] Class: Print  . Order #: [id number] Class: Print  . Order #: [id number] Class: Historical Med  . Order #: [id number] Class: Print  . Order #: [id number] Class: Print  . Order #: [id number] Class: Historical Med    Allergies Levofloxacin and Zolpidem tartrate  History reviewed. No pertinent family history.  Social History Social History  Substance Use Topics  . Smoking status:  Never Smoker  . Smokeless tobacco: Never Used  . Alcohol use No    Review of Systems  Constitutional: No fever/chills Eyes: No visual changes. ENT: No sore throat. Cardiovascular: Denies chest pain. Respiratory: Denies shortness of breath. Gastrointestinal: No abdominal pain.  No nausea, no vomiting.  No diarrhea.  No constipation. Genitourinary: Negative for dysuria. Musculoskeletal: Negative for back pain. Positive left hand pain and knee pain.  Skin: Negative for rash. Neurological: Negative for headaches, focal weakness or numbness.  10-point ROS otherwise negative.  ____________________________________________   PHYSICAL EXAM:  VITAL SIGNS: ED Triage Vitals [12/08/15 1537]  Enc Vitals Group     BP 144/84     Pulse Rate 99     Resp 18     Temp 98.4 F (36.9 C)     Temp Source Oral     SpO2 98 %     Weight 218 lb (98.9 kg)     Height 5\' 4"  (1.626 m)     Pain Score 8   Constitutional: Alert and oriented. Well appearing and in no acute distress. Eyes: Conjunctivae are normal.  Head: Atraumatic. Nose: No congestion/rhinnorhea. Mouth/Throat: Mucous membranes are moist. Neck: No stridor.   Cardiovascular: Normal rate, regular rhythm. Good peripheral circulation. Grossly normal heart sounds.   Respiratory: Normal respiratory effort.  No retractions. Lungs CTAB. Gastrointestinal: Soft and nontender. No distention.  Musculoskeletal: Left hand swelling over 4th and 5th metacarpals with mild bruising. No open laceration. Mild shallow abrasion over palmar  surface. Mild left knee pain to palpation with minimal swelling. No proximal fibular tenderness or ankle pain.  Neurologic:  Normal speech and language. No gross focal neurologic deficits are appreciated.  Skin:  Skin is warm, dry and intact. No rash noted.  ____________________________________________  RADIOLOGY  Dg Knee Complete 4 Views Left  Result Date: 12/08/2015 CLINICAL DATA:  60 year old female status post  fall today hitting knee. Pain. Initial encounter. EXAM: LEFT KNEE - COMPLETE 4+ VIEW COMPARISON:  None. FINDINGS: Sequelae of total knee arthroplasty. Hardware appears intact and normally aligned. Possible small joint effusion. Postoperative changes to the patella and superimposed patellar degenerative spurring. Osteopenia. No acute fracture or dislocation identified. IMPRESSION: No acute fracture or dislocation identified about the left knee. Prior total knee arthroplasty. Possible small joint effusion. Electronically Signed   By: Odessa FlemingH  Hall M.D.   On: 12/08/2015 16:11   Dg Hand Complete Left  Result Date: 12/08/2015 CLINICAL DATA:  Larey SeatFell and hit back of hand.  Swelling with abrasion. EXAM: LEFT HAND - COMPLETE 3+ VIEW COMPARISON:  None. FINDINGS: Mildly displaced fracture involving the base of the little finger proximal phalanx. Fracture is displaced towards the ulnar aspect of the hand. Fracture does not appear to involve the MCP joint. No other fractures are identified. Wrist is located. IMPRESSION: Mildly displaced fracture involving the little finger proximal phalanx. Electronically Signed   By: Richarda OverlieAdam  Henn M.D.   On: 12/08/2015 16:12    ____________________________________________   PROCEDURES  Procedure(s) performed:   Procedures  None ____________________________________________   INITIAL IMPRESSION / ASSESSMENT AND PLAN / ED COURSE  Pertinent labs & imaging results that were available during my care of the patient were reviewed by me and considered in my medical decision making (see chart for details).  Patient resents to the emergency department for evaluation of left hand pain and left knee pain after mechanical fall. The patient has a mildly displaced proximal phalanx fracture. No overlying laceration. She does have some hand abrasions. No snuffbox tenderness. No other areas of tenderness including the cervical spine. No clear indication for CT scan of the head at this time. Patient  is right-hand dominant. Plan to arrange for hand surgery follow-up and splinting.  06:09 PM Spoke with Dr. Amanda PeaGramig with hand surgery. Plan for ulnar gutter splint and hand surgery follow up. Will discharge with pain medication and strong return precautions.   At this time, I do not feel there is any life-threatening condition present. I have reviewed and discussed all results (EKG, imaging, lab, urine as appropriate), exam findings with patient. I have reviewed nursing notes and appropriate previous records.  I feel the patient is safe to be discharged home without further emergent workup. Discussed usual and customary return precautions. Patient and family (if present) verbalize understanding and are comfortable with this plan.  Patient will follow-up with their primary care provider. If they do not have a primary care provider, information for follow-up has been provided to them. All questions have been answered.  ____________________________________________  FINAL CLINICAL IMPRESSION(S) / ED DIAGNOSES  Final diagnoses:  Fall, initial encounter  Closed fracture of left hand, initial encounter  Acute pain of left knee     MEDICATIONS GIVEN DURING THIS VISIT:  Medications  HYDROcodone-acetaminophen (NORCO/VICODIN) 5-325 MG per tablet 1 tablet (1 tablet Oral Given 12/08/15 1737)     NEW OUTPATIENT MEDICATIONS STARTED DURING THIS VISIT:  Discharge Medication List as of 12/08/2015  6:13 PM    START taking these medications  Details  docusate sodium (COLACE) 100 MG capsule Take 1 capsule (100 mg total) by mouth every 12 (twelve) hours., Starting Tue 12/08/2015, Print    HYDROcodone-acetaminophen (NORCO/VICODIN) 5-325 MG tablet Take 2 tablets by mouth every 4 (four) hours as needed., Starting Tue 12/08/2015, Print          Note:  This document was prepared using Dragon voice recognition software and may include unintentional dictation errors.  Alona Bene, MD Emergency  Medicine   Maia Plan, MD 12/09/15 [phone]

## 2015-12-08 NOTE — ED Triage Notes (Signed)
Patient mis-stepped on while walking, states she fell onto her left arm, and knee, and also struck her head. Patient reports intermittent use of baby aspirin, and a knee replacement several years ago. Patient is A&Ox4, NAD noted. Friend at bedside reports the patient did not lose consciousness, but was "a little fuzzy".

## 2016-02-08 ENCOUNTER — Emergency Department (HOSPITAL_BASED_OUTPATIENT_CLINIC_OR_DEPARTMENT_OTHER)
Admission: EM | Admit: 2016-02-08 | Discharge: 2016-02-08 | Disposition: A | Discharge status: Home / Self Care | Attending: Emergency Medicine | Admitting: Emergency Medicine

## 2016-02-08 ENCOUNTER — Encounter (HOSPITAL_BASED_OUTPATIENT_CLINIC_OR_DEPARTMENT_OTHER): Payer: Self-pay | Admitting: Emergency Medicine

## 2016-02-08 ENCOUNTER — Emergency Department (HOSPITAL_BASED_OUTPATIENT_CLINIC_OR_DEPARTMENT_OTHER)

## 2016-02-08 DIAGNOSIS — R05 Cough: Secondary | ICD-10-CM | POA: Diagnosis present

## 2016-02-08 DIAGNOSIS — Z79899 Other long term (current) drug therapy: Secondary | ICD-10-CM | POA: Diagnosis not present

## 2016-02-08 DIAGNOSIS — E119 Type 2 diabetes mellitus without complications: Secondary | ICD-10-CM | POA: Insufficient documentation

## 2016-02-08 DIAGNOSIS — J069 Acute upper respiratory infection, unspecified: Secondary | ICD-10-CM | POA: Insufficient documentation

## 2016-02-08 DIAGNOSIS — Z7984 Long term (current) use of oral hypoglycemic drugs: Secondary | ICD-10-CM | POA: Insufficient documentation

## 2016-02-08 DIAGNOSIS — J45909 Unspecified asthma, uncomplicated: Secondary | ICD-10-CM | POA: Insufficient documentation

## 2016-02-08 MED ORDER — PREDNISONE 10 MG PO TABS: 20.0000 mg | ORAL_TABLET | Freq: Every day | ORAL | 0 refills | Status: AC

## 2016-02-08 MED ORDER — IPRATROPIUM-ALBUTEROL 0.5-2.5 (3) MG/3ML IN SOLN
3.0000 mL | Freq: Once | RESPIRATORY_TRACT | Status: AC
  Administered 2016-02-08: 3 mL via RESPIRATORY_TRACT
  Filled 2016-02-08: qty 3

## 2016-02-08 MED ORDER — ALBUTEROL SULFATE HFA 108 (90 BASE) MCG/ACT IN AERS: 1.0000 | INHALATION_SPRAY | Freq: Four times a day (QID) | RESPIRATORY_TRACT | 0 refills | Status: AC | PRN

## 2016-02-08 MED ORDER — BENZONATATE 100 MG PO CAPS: 100.0000 mg | ORAL_CAPSULE | Freq: Three times a day (TID) | ORAL | 0 refills | Status: DC

## 2016-02-08 MED FILL — PROAIR HFA 90 MCG INHALER: 108 (90 BAS | 25 days supply | Qty: 9 | Fill #0

## 2016-02-08 MED FILL — predniSONE 10 MG TABS: 10 | 7 days supply | Qty: 15 | Fill #0

## 2016-02-08 MED FILL — BENZONATATE 100 MG CAP: 100 | 7 days supply | Qty: 21 | Fill #0

## 2016-02-08 NOTE — ED Provider Notes (Signed)
[facility] Note   CSN: [id number] Arrival date & time: 02/08/16  4098     History   Chief Complaint Chief Complaint  Patient presents with  . Cough  . Nasal Congestion    HPI Denise Gallagher is a 61 y.o. female presents to the ED complaining of worsening cough, nasal congestion in the last 3 days. She states that she has a history of persistent cough that is being monitored by her primary care physician but now has been increasingly worse with her nasal congestion. She reports productive cough with green and scant red sputum. She denies any bright red blood. She describes associated shortness of breath secondary to cough fatigue, chills, sinus pressure, and sore throat. She states that she is tried over-the-counter medications and drinking a lot of water each day without relief. She states lying down makes her symptoms worse. Patient admits to history of asthma. Patient denies history of COPD, recent travel outside the [place]. She denies nausea, vomiting, dysuria, changes in bowel movements, neck pain, neck stiffness.   The history is provided by the patient. No language interpreter was used.    Past Medical History:  Diagnosis Date  . Asthma   . Diabetes mellitus without complication (HCC)   . Gall bladder inflammation   . Kidney stone   . Thyroid disease   . Vertigo     There are no active problems to display for this patient.   Past Surgical History:  Procedure Laterality Date  . ABDOMINAL HYSTERECTOMY    . KNEE SURGERY Bilateral     OB History    No data available       Home Medications    Prior to Admission medications   Medication Sig Start Date End Date Taking? Authorizing Provider  Ascorbic Acid (VITAMIN C CR) 500 MG CPCR Take 500 mg by mouth daily. 03/15/13  Yes Historical Provider, MD  Calcium Citrate-Vitamin D (CALCIUM + D PO) Take 1 tablet by mouth 4 (four) times a week.   Yes Historical Provider, MD  Cholecalciferol (D 2000)  2000 units TABS Take 2,000 Units by mouth daily. 03/15/13  Yes Historical Provider, MD  docusate sodium (COLACE) 100 MG capsule Take 1 capsule (100 mg total) by mouth every 12 (twelve) hours. 12/08/15  Yes Maia Plan, MD  levothyroxine (SYNTHROID, LEVOTHROID) 25 MCG tablet Take 25 mcg by mouth daily before breakfast. 01/07/15  Yes Historical Provider, MD  metFORMIN (GLUCOPHAGE-XR) 500 MG 24 hr tablet Take 500 mg by mouth 2 (two) times daily. 01/07/15  Yes Historical Provider, MD  traMADol (ULTRAM) 50 MG tablet Take 50 mg by mouth every morning. For cough 02/17/15  Yes Historical Provider, MD  albuterol (PROVENTIL HFA;VENTOLIN HFA) 108 (90 Base) MCG/ACT inhaler Inhale 1-2 puffs into the lungs every 6 (six) hours as needed for wheezing or shortness of breath. 02/08/16   Dvante Hands Manuel [place], [place]  benzonatate (TESSALON) 100 MG capsule Take 1 capsule (100 mg total) by mouth every 8 (eight) hours. 02/08/16   Lahna Nath [place], [place]  HYDROcodone-acetaminophen (NORCO/VICODIN) 5-325 MG tablet Take 2 tablets by mouth every 4 (four) hours as needed. 12/08/15   Maia Plan, MD  meclizine (ANTIVERT) 12.5 MG tablet Take 1 tablet (12.5 mg total) by mouth 3 (three) times daily as needed for dizziness. 03/04/15   Glynn Octave, MD  omeprazole (PRILOSEC) 40 MG capsule Take 40 mg by mouth daily. 11/27/14   Historical Provider, MD  ondansetron (ZOFRAN ODT) 4 MG disintegrating  tablet 4mg  ODT q4 hours prn nausea/vomit 06/11/14   Robyn M Hess, PA-C  oxyCODONE-acetaminophen (PERCOCET) 5-325 MG per tablet Take 1-2 tablets by mouth every 6 (six) hours as needed for severe pain. 06/11/14   Kathrynn Speed, PA-C  predniSONE (DELTASONE) 10 MG tablet Take 2 tablets (20 mg total) by mouth daily. 02/08/16   Treshun Wold [place], [place]  tamsulosin (FLOMAX) 0.4 MG CAPS capsule Take 0.4 mg by mouth daily as needed (for urine flow).  12/25/14   Historical Provider, MD    Family History No family history on file.  Social  History Social History  Substance Use Topics  . Smoking status: Never Smoker  . Smokeless tobacco: Never Used  . Alcohol use No     Allergies   Levofloxacin and Zolpidem tartrate   Review of Systems Review of Systems  Constitutional: Positive for chills. Negative for fever.  HENT: Positive for congestion, rhinorrhea, sinus pressure and sore throat. Negative for ear pain, hearing loss and trouble swallowing.   Eyes: Negative for photophobia and visual disturbance.  Respiratory: Positive for cough and shortness of breath. Negative for wheezing.   Cardiovascular: Negative for chest pain.  Gastrointestinal: Negative for abdominal pain, diarrhea and vomiting.  Genitourinary: Negative for difficulty urinating and dysuria.  Musculoskeletal: Negative for neck pain and neck stiffness.  Skin: Negative for rash and wound.  Neurological: Negative for speech difficulty and numbness.     Physical Exam Updated Vital Signs BP (!) 96/52 (BP Location: Right Arm)   Pulse 96   Temp 97.7 F (36.5 C) (Oral)   Resp 16   Ht 5\' 4"  (1.626 m)   Wt 97.5 kg   SpO2 95%   BMI 36.90 kg/m   Physical Exam  Constitutional: She is oriented to person, place, and time. She appears well-developed and well-nourished.  Well-appearing. Coughing throughout exam.  HENT:  Head: Normocephalic and atraumatic.  Nose: Nose normal.  Mouth/Throat: Oropharynx is clear and moist. No oropharyngeal exudate.  Mild erythema to oropharynx. No evidence of tonsillar erythema, swelling, exudates. Uvula midline.  Eyes: EOM are normal. Pupils are equal, round, and reactive to light.  Neck: Normal range of motion.  Cardiovascular: Normal rate and normal heart sounds.   Pulmonary/Chest: Effort normal. No respiratory distress. She has no wheezes. She has no rales.  Lungs sound clear, no wheezing, rales, or sounds.  Abdominal: Soft. There is no tenderness. There is no rebound, no guarding, no tenderness at McBurney's point and  negative Murphy's sign.  Neurological: She is alert and oriented to person, place, and time.  Skin: Skin is warm.  Psychiatric: She has a normal mood and affect. Her behavior is normal.  Nursing note and vitals reviewed.    ED Treatments / Results  Labs (all labs ordered are listed, but only abnormal results are displayed) Labs Reviewed - No data to display  EKG  EKG Interpretation None       Radiology Dg Chest 2 View  Result Date: 02/08/2016 CLINICAL DATA:  Off, fever, and chest congestion for the past week. EXAM: CHEST  2 VIEW COMPARISON:  PA and lateral chest x-ray of December 05, 2015 FINDINGS: The lungs are adequately inflated. There is no focal infiltrate. There is no pleural effusion. The heart and pulmonary vascularity are normal. The mediastinum is normal in width. The bony thorax exhibits no acute abnormality. IMPRESSION: There is no active cardiopulmonary disease. Electronically Signed   By: David  [place] M.D.   On: 02/08/2016 10:01  Procedures Procedures (including critical care time)  Medications Ordered in ED Medications  ipratropium-albuterol (DUONEB) 0.5-2.5 (3) MG/3ML nebulizer solution 3 mL (3 mLs Nebulization Given 02/08/16 1132)     Initial Impression / Assessment and Plan / ED Course  I have reviewed the triage vital signs and the nursing notes.  Pertinent labs & imaging results that were available during my care of the patient were reviewed by me and considered in my medical decision making (see chart for details).    Pt is a 61 yo female presents with cough and congestion for 1 week . On exam, pt in NAD. VSS. No hypoxia. Afebrile. Lungs clear, Heart sounds clear. Normal work of breathing. TMs clear. Throat benign. Abdomen nontender/soft. Pt CXR negative for acute infiltrate. Patients symptoms are consistent with URI, likely viral etiology. Discussed that antibiotics are not indicated for viral infections. Patient felt better after breathing treatment.  Pt will be discharged with symptomatic treatment.  Verbalizes understanding and is agreeable with plan. Pt is hemodynamically stable & in NAD prior to dc.  Final Clinical Impressions(s) / ED Diagnoses   Final diagnoses:  Upper respiratory tract infection, unspecified type    New Prescriptions Discharge Medication List as of 02/08/2016 11:44 AM    START taking these medications   Details  predniSONE (DELTASONE) 10 MG tablet Take 2 tablets (20 mg total) by mouth daily., Starting Mon 02/08/2016, Print         [address]Raegen Tarpley [place], [place] 02/08/16 1743    Benjiman CoreNathan Pickering, MD 02/11/16 [phone]

## 2016-02-08 NOTE — Discharge Instructions (Signed)
Please take 2 tablets of prednisone by mouth for 7 days. Placed on albuterol every 6 hours as needed for shortness of breath. This take Tessalon Perles as needed for cough. Please follow-up with your primary care physician in one week if symptoms have not changed or worsened. Drink at least 6 8-oz glasses of water throughout the day.  Get help right away if: You have severe or persistent: Headache. Ear pain. Sinus pain. Chest pain. You have chronic lung disease and any of the following: Wheezing. Prolonged cough. Coughing up blood. A change in your usual mucus. You have a stiff neck. You have changes in your: Vision. Hearing. Thinking. Mood.

## 2016-02-08 NOTE — ED Notes (Signed)
Pt ambulatory to bathroom without assistance.

## 2016-02-08 NOTE — ED Triage Notes (Signed)
Pt presents with cough and congestion symptoms x1 week. Reports she had used an advair disc inhaler to help with her shallow breathing. Denies fever.

## 2016-02-08 NOTE — ED Notes (Signed)
ED Provider at bedside. 

## 2017-02-13 ENCOUNTER — Other Ambulatory Visit: Payer: Self-pay

## 2017-02-13 ENCOUNTER — Emergency Department (HOSPITAL_BASED_OUTPATIENT_CLINIC_OR_DEPARTMENT_OTHER)

## 2017-02-13 ENCOUNTER — Emergency Department (HOSPITAL_BASED_OUTPATIENT_CLINIC_OR_DEPARTMENT_OTHER)
Admission: EM | Admit: 2017-02-13 | Discharge: 2017-02-13 | Disposition: A | Discharge status: Home / Self Care | Attending: Physician Assistant | Admitting: Physician Assistant

## 2017-02-13 ENCOUNTER — Encounter (HOSPITAL_BASED_OUTPATIENT_CLINIC_OR_DEPARTMENT_OTHER): Payer: Self-pay | Admitting: Emergency Medicine

## 2017-02-13 DIAGNOSIS — J45909 Unspecified asthma, uncomplicated: Secondary | ICD-10-CM | POA: Diagnosis not present

## 2017-02-13 DIAGNOSIS — Z79899 Other long term (current) drug therapy: Secondary | ICD-10-CM | POA: Diagnosis not present

## 2017-02-13 DIAGNOSIS — R05 Cough: Secondary | ICD-10-CM

## 2017-02-13 DIAGNOSIS — R059 Cough, unspecified: Secondary | ICD-10-CM

## 2017-02-13 DIAGNOSIS — E119 Type 2 diabetes mellitus without complications: Secondary | ICD-10-CM | POA: Diagnosis not present

## 2017-02-13 DIAGNOSIS — Z7984 Long term (current) use of oral hypoglycemic drugs: Secondary | ICD-10-CM | POA: Diagnosis not present

## 2017-02-13 DIAGNOSIS — J069 Acute upper respiratory infection, unspecified: Secondary | ICD-10-CM | POA: Insufficient documentation

## 2017-02-13 MED ORDER — BENZONATATE 100 MG PO CAPS: 100.0000 mg | ORAL_CAPSULE | Freq: Three times a day (TID) | ORAL | 0 refills | Status: AC | PRN

## 2017-02-13 NOTE — Discharge Instructions (Signed)
Please consider taking mucinex for your congestion.  It is important to take this with extra water to help thin your secretions.   I have given you a prescription for cough medicine, please only take this at night.   Please consider taking a nasal steroid to help with the swelling in your sinuses.  I recommend nasacort.   Please follow up with your primary care doctor.

## 2017-02-13 NOTE — ED Triage Notes (Signed)
Patient has had a cough x 1 week  - patient states that she has had a course of antibiotics and it has not helped

## 2017-02-13 NOTE — ED Provider Notes (Signed)
[facility] EMERGENCY DEPARTMENT Provider Note   CSN: [id number] Arrival date & time: 02/13/17  1600     History   Chief Complaint Chief Complaint  Patient presents with  . Cough    HPI Denise Gallagher is a 62 y.o. female with a history of diabetes, presents today for evaluation of cough.  She reports that she has had a cough for approximately 1 week.  Initially she went to her primary care provider who gave her a prescription for a Z-Pak.  She reports that she did not have significant improvement.  She has not been trying anything else for her cough.  She also reports nasal congestion.  Her cough is occasionally productive.  She has never smoked.  No fevers or chills, denies shortness of breath.  HPI  Past Medical History:  Diagnosis Date  . Asthma   . Diabetes mellitus without complication (HCC)   . Gall bladder inflammation   . Kidney stone   . Thyroid disease   . Vertigo     There are no active problems to display for this patient.   Past Surgical History:  Procedure Laterality Date  . ABDOMINAL HYSTERECTOMY    . KNEE SURGERY Bilateral     OB History    No data available       Home Medications    Prior to Admission medications   Medication Sig Start Date End Date Taking? Authorizing Provider  albuterol (PROVENTIL HFA;VENTOLIN HFA) 108 (90 Base) MCG/ACT inhaler Inhale 1-2 puffs into the lungs every 6 (six) hours as needed for wheezing or shortness of breath. 02/08/16   Espina, [place], [place]  Ascorbic Acid (VITAMIN C CR) 500 MG CPCR Take 500 mg by mouth daily. 03/15/13   [provider]  benzonatate (TESSALON) 100 MG capsule Take 1 capsule (100 mg total) by mouth 3 (three) times daily as needed for cough. 02/13/17   Cristina Gong, PA-C  Calcium Citrate-Vitamin D (CALCIUM + D PO) Take 1 tablet by mouth 4 (four) times a week.    [provider]  Cholecalciferol (D 2000) 2000 units TABS Take 2,000 Units by mouth daily. 03/15/13    [provider]  docusate sodium (COLACE) 100 MG capsule Take 1 capsule (100 mg total) by mouth every 12 (twelve) hours. 12/08/15   Long, Arlyss Repress, MD  HYDROcodone-acetaminophen (NORCO/VICODIN) 5-325 MG tablet Take 2 tablets by mouth every 4 (four) hours as needed. 12/08/15   Long, Arlyss Repress, MD  levothyroxine (SYNTHROID, LEVOTHROID) 25 MCG tablet Take 25 mcg by mouth daily before breakfast. 01/07/15   [provider]  meclizine (ANTIVERT) 12.5 MG tablet Take 1 tablet (12.5 mg total) by mouth 3 (three) times daily as needed for dizziness. 03/04/15   Rancour, Jeannett Senior, MD  metFORMIN (GLUCOPHAGE-XR) 500 MG 24 hr tablet Take 500 mg by mouth 2 (two) times daily. 01/07/15   [provider]  omeprazole (PRILOSEC) 40 MG capsule Take 40 mg by mouth daily. 11/27/14   [provider]  ondansetron (ZOFRAN ODT) 4 MG disintegrating tablet 4mg  ODT q4 hours prn nausea/vomit 06/11/14   Hess, Melina Schools M, PA-C  oxyCODONE-acetaminophen (PERCOCET) 5-325 MG per tablet Take 1-2 tablets by mouth every 6 (six) hours as needed for severe pain. 06/11/14   Hess, Nada Boozer, PA-C  predniSONE (DELTASONE) 10 MG tablet Take 2 tablets (20 mg total) by mouth daily. 02/08/16   Alvina Chou, PA  tamsulosin (FLOMAX) 0.4 MG CAPS capsule Take 0.4 mg by mouth daily  as needed (for urine flow).  12/25/14   [provider]  traMADol (ULTRAM) 50 MG tablet Take 50 mg by mouth every morning. For cough 02/17/15   [provider]    Family History History reviewed. No pertinent family history.  Social History Social History   Tobacco Use  . Smoking status: Never Smoker  . Smokeless tobacco: Never Used  Substance Use Topics  . Alcohol use: No  . Drug use: No     Allergies   Levofloxacin and Zolpidem tartrate   Review of Systems Review of Systems  Constitutional: Negative for chills, fatigue and fever.  HENT: Positive for congestion, postnasal drip, rhinorrhea and sore throat.    Eyes: Negative for visual disturbance.  Respiratory: Positive for cough. Negative for shortness of breath.   Gastrointestinal: Negative for abdominal pain, nausea and vomiting.  Skin: Negative for rash.  Neurological: Negative for headaches.  All other systems reviewed and are negative.    Physical Exam Updated Vital Signs BP (!) 141/80 (BP Location: Right Arm)   Pulse 74   Temp 98.2 F (36.8 C) (Oral)   Resp (!) 22   Ht 5\' 4"  (1.626 m)   Wt 97.5 kg (215 lb)   SpO2 96%   BMI 36.90 kg/m   Physical Exam  Constitutional: She appears well-developed and well-nourished. No distress.  HENT:  Head: Normocephalic and atraumatic.  Right Ear: Tympanic membrane, external ear and ear canal normal.  Left Ear: Tympanic membrane, external ear and ear canal normal.  Nose: Mucosal edema and rhinorrhea present.  Mouth/Throat: Uvula is midline, oropharynx is clear and moist and mucous membranes are normal. No oropharyngeal exudate. No tonsillar exudate.  There is bilateral serous effusion behind TM with out evidence of infection.   Eyes: Conjunctivae are normal. No scleral icterus.  Neck: Normal range of motion. Neck supple.  Cardiovascular: Normal rate and regular rhythm.  Pulmonary/Chest: Effort normal and breath sounds normal. No accessory muscle usage. No respiratory distress. She has no decreased breath sounds. She has no wheezes. She has no rhonchi.  Lymphadenopathy:    She has no cervical adenopathy.  Neurological: She is alert.  Skin: Skin is warm and dry. She is not diaphoretic.     ED Treatments / Results  Labs (all labs ordered are listed, but only abnormal results are displayed) Labs Reviewed - No data to display  EKG  EKG Interpretation None       Radiology Dg Chest 2 View  Result Date: 02/13/2017 CLINICAL DATA:  Productive cough EXAM: CHEST  2 VIEW COMPARISON:  02/08/2016 FINDINGS: Heart and mediastinal contours are within normal limits. No focal opacities or  effusions. No acute bony abnormality. IMPRESSION: No active cardiopulmonary disease. Electronically Signed   By: Charlett Nose M.D.   On: 02/13/2017 17:20    Procedures Procedures (including critical care time)  Medications Ordered in ED Medications - No data to display   Initial Impression / Assessment and Plan / ED Course  I have reviewed the triage vital signs and the nursing notes.  Pertinent labs & imaging results that were available during my care of the patient were reviewed by me and considered in my medical decision making (see chart for details).    Pt CXR negative for acute infiltrate. Patients symptoms are consistent with URI, likely viral etiology.  She was initially seen by her PCP who gave her a Z-Pak for her symptoms and she did not have improvement, no indication for additional antibiotics at this  point.  Pt will be discharged with symptomatic treatment.  Verbalizes understanding and is agreeable with plan. Pt is hemodynamically stable & in NAD prior to dc.  As she reports that her cough has been keeping her up at night she was given a prescription for Tessalon with instructions to only take it at night.    Final Clinical Impressions(s) / ED Diagnoses   Final diagnoses:  Cough    ED Discharge Orders        Ordered    benzonatate (TESSALON) 100 MG capsule  3 times daily PRN     02/13/17 1754       Cristina GongHammond, Haruna Rohlfs W, PA-C 02/13/17 1758    Abelino DerrickMackuen, Courteney Lyn, MD 02/15/17 [phone]

## 2018-11-21 ENCOUNTER — Emergency Department (HOSPITAL_BASED_OUTPATIENT_CLINIC_OR_DEPARTMENT_OTHER)
Admission: EM | Admit: 2018-11-21 | Discharge: 2018-11-21 | Disposition: A | Discharge status: Home / Self Care | Attending: Emergency Medicine | Admitting: Emergency Medicine

## 2018-11-21 ENCOUNTER — Emergency Department (HOSPITAL_BASED_OUTPATIENT_CLINIC_OR_DEPARTMENT_OTHER)

## 2018-11-21 ENCOUNTER — Encounter (HOSPITAL_BASED_OUTPATIENT_CLINIC_OR_DEPARTMENT_OTHER): Payer: Self-pay

## 2018-11-21 ENCOUNTER — Other Ambulatory Visit: Payer: Self-pay

## 2018-11-21 DIAGNOSIS — M545 Low back pain: Secondary | ICD-10-CM | POA: Insufficient documentation

## 2018-11-21 DIAGNOSIS — Z79899 Other long term (current) drug therapy: Secondary | ICD-10-CM | POA: Diagnosis not present

## 2018-11-21 DIAGNOSIS — N950 Postmenopausal bleeding: Secondary | ICD-10-CM | POA: Insufficient documentation

## 2018-11-21 DIAGNOSIS — E119 Type 2 diabetes mellitus without complications: Secondary | ICD-10-CM | POA: Insufficient documentation

## 2018-11-21 DIAGNOSIS — Z7984 Long term (current) use of oral hypoglycemic drugs: Secondary | ICD-10-CM | POA: Diagnosis not present

## 2018-11-21 DIAGNOSIS — R1013 Epigastric pain: Secondary | ICD-10-CM | POA: Diagnosis not present

## 2018-11-21 DIAGNOSIS — M549 Dorsalgia, unspecified: Secondary | ICD-10-CM

## 2018-11-21 DIAGNOSIS — J45909 Unspecified asthma, uncomplicated: Secondary | ICD-10-CM | POA: Diagnosis not present

## 2018-11-21 DIAGNOSIS — N939 Abnormal uterine and vaginal bleeding, unspecified: Secondary | ICD-10-CM

## 2018-11-21 LAB — CBC WITH DIFFERENTIAL/PLATELET
Abs Immature Granulocytes: 0.01 10*3/uL (ref 0.00–0.07)
Basophils Absolute: 0.1 10*3/uL (ref 0.0–0.1)
Basophils Relative: 1 %
Eosinophils Absolute: 0.2 10*3/uL (ref 0.0–0.5)
Eosinophils Relative: 3 %
HCT: 42.9 % (ref 36.0–46.0)
Hemoglobin: 13.7 g/dL (ref 12.0–15.0)
Immature Granulocytes: 0 %
Lymphocytes Relative: 39 %
Lymphs Abs: 3 10*3/uL (ref 0.7–4.0)
MCH: 28.2 pg (ref 26.0–34.0)
MCHC: 31.9 g/dL (ref 30.0–36.0)
MCV: 88.3 fL (ref 80.0–100.0)
Monocytes Absolute: 0.8 10*3/uL (ref 0.1–1.0)
Monocytes Relative: 10 %
Neutro Abs: 3.7 10*3/uL (ref 1.7–7.7)
Neutrophils Relative %: 47 %
Platelets: 276 10*3/uL (ref 150–400)
RBC: 4.86 MIL/uL (ref 3.87–5.11)
RDW: 13.2 % (ref 11.5–15.5)
WBC: 7.7 10*3/uL (ref 4.0–10.5)
nRBC: 0 % (ref 0.0–0.2)

## 2018-11-21 LAB — COMPREHENSIVE METABOLIC PANEL
ALT: 56 U/L — ABNORMAL HIGH (ref 0–44)
AST: 51 U/L — ABNORMAL HIGH (ref 15–41)
Albumin: 3.6 g/dL (ref 3.5–5.0)
Alkaline Phosphatase: 84 U/L (ref 38–126)
Anion gap: 10 (ref 5–15)
BUN: 14 mg/dL (ref 8–23)
CO2: 21 mmol/L — ABNORMAL LOW (ref 22–32)
Calcium: 9.2 mg/dL (ref 8.9–10.3)
Chloride: 105 mmol/L (ref 98–111)
Creatinine, Ser: 0.55 mg/dL (ref 0.44–1.00)
GFR calc Af Amer: >60 mL/min (ref >60)
GFR calc non Af Amer: >60 mL/min (ref >60)
Glucose, Bld: 96 mg/dL (ref 70–99)
Potassium: 4.2 mmol/L (ref 3.5–5.1)
Sodium: 136 mmol/L (ref 135–145)
Total Bilirubin: 0.3 mg/dL (ref 0.3–1.2)
Total Protein: 7.2 g/dL (ref 6.5–8.1)

## 2018-11-21 LAB — WET PREP, GENITAL
Clue Cells Wet Prep HPF POC: NONE SEEN
Sperm: NONE SEEN
Trich, Wet Prep: NONE SEEN
Yeast Wet Prep HPF POC: NONE SEEN

## 2018-11-21 LAB — LIPASE, BLOOD: Lipase: 26 U/L (ref 11–51)

## 2018-11-21 LAB — URINALYSIS, ROUTINE W REFLEX MICROSCOPIC
Bilirubin Urine: NEGATIVE
Glucose, UA: NEGATIVE mg/dL
Hgb urine dipstick: NEGATIVE
Ketones, ur: NEGATIVE mg/dL
Leukocytes,Ua: NEGATIVE
Nitrite: NEGATIVE
Protein, ur: NEGATIVE mg/dL
Specific Gravity, Urine: 1.015 (ref 1.005–1.030)
pH: 6 (ref 5.0–8.0)

## 2018-11-21 MED ORDER — METHOCARBAMOL 500 MG PO TABS: 500.0000 mg | ORAL_TABLET | Freq: Three times a day (TID) | ORAL | 0 refills | Status: AC | PRN

## 2018-11-21 MED ORDER — IOHEXOL 300 MG/ML  SOLN
100.0000 mL | Freq: Once | INTRAMUSCULAR | Status: AC | PRN
  Administered 2018-11-21: 100 mL via INTRAVENOUS

## 2018-11-21 MED ORDER — KETOROLAC TROMETHAMINE 15 MG/ML IJ SOLN
15.0000 mg | Freq: Once | INTRAMUSCULAR | Status: AC
  Administered 2018-11-21: 15 mg via INTRAVENOUS
  Filled 2018-11-21: qty 1

## 2018-11-21 MED ORDER — LIDOCAINE 5 % EX PTCH: 1.0000 | MEDICATED_PATCH | CUTANEOUS | 0 refills | Status: DC

## 2018-11-21 NOTE — Discharge Instructions (Addendum)
You were seen in the emergency department today for an episode of vaginal bleeding and for back pain.  Your work-up was overall reassuring.  Your labs and CT scan showed some findings of fatty liver changes, please discuss with your primary care provider.  There were some degenerative changes noted in your lower back which often come with aging.  There were no acute/new findings on your CT scan   We are sending you home with the following medicines:  - Lidoderm patch- this is a topical patch to place directly over the right lower back once per day to help soothe the area.   - Robaxin is the muscle relaxer I have prescribed, this is meant to help with muscle tightness. Be aware that this medication may make you drowsy therefore the first time you take this it should be at a time you are in an environment where you can rest. Do not drive or operate heavy machinery when taking this medication. Do not drink alcohol or take other sedating medications with this medicine such as narcotics or benzodiazepines.   Please continue your diclofenac & apply heat to the lower back.  You make take Tylenol per over the counter dosing with these medications.   We have prescribed you new medication(s) today. Discuss the medications prescribed today with your pharmacist as they can have adverse effects and interactions with your other medicines including over the counter and prescribed medications. Seek medical evaluation if you start to experience new or abnormal symptoms after taking one of these medicines, seek care immediately if you start to experience difficulty breathing, feeling of your throat closing, facial swelling, or rash as these could be indications of a more serious allergic reaction  Please follow up with primary care in regards to your back pain within 1 week.   Please follow up with a gynecologist for further evaluation of your vaginal bleeding, do so within the next 3 days.   Return to the Er for  new or worsening symptoms including but not limited to increased pain, fever, numbness, weakness, passing out, abdominal pain, worsened bleeding, or any other concerns.

## 2018-11-21 NOTE — ED Notes (Signed)
ED Provider at bedside. 

## 2018-11-21 NOTE — ED Triage Notes (Signed)
Pt c/o "spotting like having a period and I had a hysterectomy"-also c/o right lower back pain-sx started yesterday-NAD-steady gait

## 2018-11-21 NOTE — ED Provider Notes (Signed)
[facility] EMERGENCY DEPARTMENT Provider Note   CSN: [id number] Arrival date & time: 11/21/18  1133     History   Chief Complaint Chief Complaint  Patient presents with   Vaginal Bleeding    HPI Denise Gallagher is a 63 y.o. female with a history of diabetes mellitus, kidney stones, asthma, vertigo, cholecystectomy, and prior abdominal hysterectomy who presents to the ED w/ complaints of back pain since yesterday & an episode of vaginal bleeding yesterday.   She reports pain is to the R lower back, it is constant, worse with movement, somewhat alleviated by diclofenac yesterday. States she lifted some heavy planter which may have lead to this. She has had similar back pain. Denies numbness, tingling, weakness, saddle anesthesia, incontinence to bowel/bladder, fever, chills, IV drug use, dysuria, or hx of cancer.  She reports an episode of vaginal bleeding yesterday- she noted a mild amount of bright red blood in her underwear that she felt was coming from her vagina. The bleeding has not reoccurred. She has had some epigastric abdominal pain since ED arrival, the epigastric abdominal pain is not atypical for her, she states this has been happening intermittently for years. No alleviating/aggravaitng factors to her sxs. Denies fever, chills, N/V/D, dysuria, vaginal discharge, or concern for STD.      HPI  Past Medical History:  Diagnosis Date   Asthma    Diabetes mellitus without complication (HCC)    Gall bladder inflammation    Kidney stone    Thyroid disease    Vertigo     There are no active problems to display for this patient.   Past Surgical History:  Procedure Laterality Date   ABDOMINAL HYSTERECTOMY     CHOLECYSTECTOMY     KNEE SURGERY Bilateral      OB History   No obstetric history on file.      Home Medications    Prior to Admission medications   Medication Sig Start Date End Date Taking? Authorizing Provider  albuterol (PROVENTIL  HFA;VENTOLIN HFA) 108 (90 Base) MCG/ACT inhaler Inhale 1-2 puffs into the lungs every 6 (six) hours as needed for wheezing or shortness of breath. 02/08/16   Espina, [place], [place]  Ascorbic Acid (VITAMIN C CR) 500 MG CPCR Take 500 mg by mouth daily. 03/15/13   [provider]  benzonatate (TESSALON) 100 MG capsule Take 1 capsule (100 mg total) by mouth 3 (three) times daily as needed for cough. 02/13/17   Cristina Gong, PA-C  Calcium Citrate-Vitamin D (CALCIUM + D PO) Take 1 tablet by mouth 4 (four) times a week.    [provider]  Cholecalciferol (D 2000) 2000 units TABS Take 2,000 Units by mouth daily. 03/15/13   [provider]  docusate sodium (COLACE) 100 MG capsule Take 1 capsule (100 mg total) by mouth every 12 (twelve) hours. 12/08/15   Long, Arlyss Repress, MD  HYDROcodone-acetaminophen (NORCO/VICODIN) 5-325 MG tablet Take 2 tablets by mouth every 4 (four) hours as needed. 12/08/15   Long, Arlyss Repress, MD  levothyroxine (SYNTHROID, LEVOTHROID) 25 MCG tablet Take 25 mcg by mouth daily before breakfast. 01/07/15   [provider]  meclizine (ANTIVERT) 12.5 MG tablet Take 1 tablet (12.5 mg total) by mouth 3 (three) times daily as needed for dizziness. 03/04/15   Rancour, Jeannett Senior, MD  metFORMIN (GLUCOPHAGE-XR) 500 MG 24 hr tablet Take 500 mg by mouth 2 (two) times daily. 01/07/15   [provider]  omeprazole (PRILOSEC) 40 MG capsule Take  40 mg by mouth daily. 11/27/14   [provider]  ondansetron (ZOFRAN ODT) 4 MG disintegrating tablet 4mg  ODT q4 hours prn nausea/vomit 06/11/14   Hess, Bailey Mech M, PA-C  oxyCODONE-acetaminophen (PERCOCET) 5-325 MG per tablet Take 1-2 tablets by mouth every 6 (six) hours as needed for severe pain. 06/11/14   Hess, Hessie Diener, PA-C  predniSONE (DELTASONE) 10 MG tablet Take 2 tablets (20 mg total) by mouth daily. 02/08/16   Bettey Costa, PA  tamsulosin (FLOMAX) 0.4 MG CAPS capsule Take 0.4 mg by mouth daily  as needed (for urine flow).  12/25/14   [provider]  traMADol (ULTRAM) 50 MG tablet Take 50 mg by mouth every morning. For cough 02/17/15   [provider]    Family History No family history on file.  Social History Social History   Tobacco Use   Smoking status: Never Smoker   Smokeless tobacco: Never Used  Substance Use Topics   Alcohol use: No   Drug use: No     Allergies   Levofloxacin and Zolpidem tartrate   Review of Systems Review of Systems  Constitutional: Negative for fever.  Respiratory: Negative for shortness of breath.   Cardiovascular: Negative for chest pain.  Gastrointestinal: Positive for abdominal pain. Negative for constipation, diarrhea and vomiting.  Genitourinary: Positive for vaginal bleeding (x1). Negative for dysuria, hematuria and vaginal discharge.  Musculoskeletal: Positive for back pain.  Neurological: Negative for weakness and numbness.       Negative for incontinence or saddle anesthesia.   All other systems reviewed and are negative.    Physical Exam Updated Vital Signs BP (!) 174/70 (BP Location: Left Arm)    Pulse 77    Temp 98.2 F (36.8 C) (Oral)    Resp 14    Ht 5\' 4"  (1.626 m)    Wt 97.5 kg    SpO2 100%    BMI 36.90 kg/m   Physical Exam Vitals signs and nursing note reviewed. Exam conducted with a chaperone present.  Constitutional:      General: She is not in acute distress.    Appearance: She is well-developed. She is not toxic-appearing.  HENT:     Head: Normocephalic and atraumatic.  Eyes:     General:        Right eye: No discharge.        Left eye: No discharge.     Conjunctiva/sclera: Conjunctivae normal.  Neck:     Musculoskeletal: Normal range of motion and neck supple. No spinous process tenderness or muscular tenderness.  Cardiovascular:     Rate and Rhythm: Normal rate and regular rhythm.  Pulmonary:     Effort: Pulmonary effort is normal. No respiratory distress.     Breath sounds:  Normal breath sounds. No wheezing, rhonchi or rales.  Abdominal:     General: There is no distension.     Palpations: Abdomen is soft.     Tenderness: There is abdominal tenderness (epigastrium & lower abdomen). There is no guarding or rebound.  Genitourinary:    Labia:        Right: No tenderness or lesion.        Left: No tenderness or lesion.      Comments: RN eliza present as chaperone.  There is what appears to be somewhat dried blood- minimal- in the vaginal vault, no active bleeding or blood clots.  No significant tenderness throughout bimanual exam.  Musculoskeletal:     Comments: No obvious deformity, appreciable  swelling, erythema, ecchymosis, significant open wounds, or increased warmth.  Extremities: Normal ROM. Nontender.  Back: R lumbar paraspinal muscle tenderness to palpation as well as lower midline lumbar tenderness. No point/focal vertebral tenderness, no palpable step off or crepitus.  Skin:    General: Skin is warm and dry.     Findings: No rash.  Neurological:     Mental Status: She is alert.     Deep Tendon Reflexes:     Reflex Scores:      Patellar reflexes are 2+ on the right side and 2+ on the left side.    Comments: Sensation grossly intact to bilateral lower extremities. 5/5 symmetric strength with plantar/dorsiflexion bilaterally. Gait is intact without obvious foot drop.   Psychiatric:        Behavior: Behavior normal.    ED Treatments / Results  Labs (all labs ordered are listed, but only abnormal results are displayed) Labs Reviewed  WET PREP, GENITAL - Abnormal; Notable for the following components:      Result Value   WBC, Wet Prep HPF POC MODERATE (*)    All other components within normal limits  COMPREHENSIVE METABOLIC PANEL - Abnormal; Notable for the following components:   CO2 21 (*)    AST 51 (*)    ALT 56 (*)    All other components within normal limits  URINALYSIS, ROUTINE W REFLEX MICROSCOPIC  CBC WITH DIFFERENTIAL/PLATELET    LIPASE, BLOOD  GC/CHLAMYDIA PROBE AMP ([place]) NOT AT [facility]    EKG None  Radiology Ct Abdomen Pelvis W Contrast  Result Date: 11/21/2018 CLINICAL DATA:  Abdominal pain, postmenopausal vaginal bleeding and right-sided flank and back pain. EXAM: CT ABDOMEN AND PELVIS WITH CONTRAST TECHNIQUE: Multidetector CT imaging of the abdomen and pelvis was performed using the standard protocol following bolus administration of intravenous contrast. CONTRAST:  OMNIPAQUE IOHEXOL 300 MG/ML  SOLN COMPARISON:  06/01/2016 FINDINGS: Lower chest: Lung bases are clear. Hepatobiliary: Signs of hepatic steatosis. No focal hepatic lesion. No signs of acute hepatic process. Portal vein is patent. Post cholecystectomy without biliary ductal dilation. Pancreas: Unremarkable. No pancreatic ductal dilatation or surrounding inflammatory changes. Spleen: Normal in size without focal abnormality. Adrenals/Urinary Tract: Normal appearance of bilateral adrenal glands. Symmetric enhancement of bilateral kidneys without signs of hydronephrosis. Nephrolithiasis on the left is similar to the recent comparison study with and interpolar and lower pole calculus. Largest in the interpolar left kidney measuring approximately 5 mm. Accessory renal arteries to lower pole of bilateral kidneys. Stomach/Bowel: No signs of acute gastrointestinal process. The appendix is normal. Small bowel is normal caliber, filled with positive enteric contrast. Vascular/Lymphatic: Scattered calcified atherosclerotic changes throughout the abdominal aorta, no signs of aneurysm. Patent abdominal vasculature. No signs of upper abdominal or retroperitoneal adenopathy with mild celiac nodal enlargement, largest just at 1 cm, not changed since 06/01/2016 Reproductive: Post hysterectomy. Other: No abdominal wall hernia or abnormality. No abdominopelvic ascites. Musculoskeletal: No acute bone finding or destructive bone process. Spinal degenerative changes.  IMPRESSION: 1. No acute intra-abdominal findings with normal appendix and changes of cholecystectomy and hysterectomy. 2. Signs of hepatic steatosis with lobular hepatic contours. Correlate with any clinical or laboratory evidence of liver disease. 3. Nonobstructive stones in the left kidney. Aortic Atherosclerosis (ICD10-I70.0). Electronically Signed   By: Donzetta Kohut M.D.   On: 11/21/2018 15:25   Ct L-spine No Charge  Result Date: 11/21/2018 CLINICAL DATA:  Low back pain EXAM: CT Lumbar Spine with contrast TECHNIQUE: Technique: Multiplanar CT images  of the lumbar spine were reconstructed from contemporary CT of the Abdomen and Pelvis. CONTRAST:  No additional contrast COMPARISON:  None FINDINGS: Segmentation: 5 lumbar type vertebrae. Alignment: Lumbar lordosis is preserved. Vertebrae: Vertebral body heights are maintained no acute fracture. There is no destructive osseous lesion. There is degenerative endplate sclerosis and irregularity at L4-L5. Paraspinal and other soft tissues: Unremarkable. Disc levels: L1-L2:  No canal or foraminal stenosis. L2-L3: Disc bulge, endplate osteophytes, and facet hypertrophy. Mild canal stenosis. Left greater than right foraminal stenosis. L3-L4: Disc bulge, endplate osteophytes, and facet hypertrophy. Mild canal stenosis. Right greater than left foraminal stenosis. L4-L5: Disc bulge, endplate osteophytes, and facet hypertrophy. Mild canal stenosis. Left greater than right foraminal stenosis. L5-S1: Disc bulge, endplate osteophytes, and facet hypertrophy. No canal or foraminal stenosis. IMPRESSION: No acute osseous abnormality. Degenerative changes without high-grade canal stenosis. There is multilevel neural foraminal stenosis. Electronically Signed   By: Guadlupe SpanishPraneil  Patel M.D.   On: 11/21/2018 15:26    Procedures Procedures (including critical care time)  Medications Ordered in ED Medications - No data to display   Initial Impression / Assessment and Plan / ED  Course  I have reviewed the triage vital signs and the nursing notes.  Pertinent labs & imaging results that were available during my care of the patient were reviewed by me and considered in my medical decision making (see chart for details).    Patient presents w/ complaints of back pain & 1 episode of vaginal bleeding.  Nontoxic appearing, vitals WNL with the exception of elevated BP- doubt HTN emergency. Plan for labs & CT imaging.   1x episode of vaginal bleeding- mild blood noted in vaginal vault, some lower abdominal tenderness, no peritoneal signs. No UTI, BV, yeast, or trich on wet prep. Labs overall reassuring, mild elevation in LFTs w/ hepatic steatosis on CT- likely cause. CT w/o acute findings. Overall reassuring ER work-up however given post menopausal status w/ bleeding she will need gyn follow up closely.   Back pain- Reproducible w/ lower midline & R lumbar paraspinal muscle palpation. CT L spine wo acute osseous abnormality- degenerative changes as above. No neuro deficits, ambulatory- do not suspect cauda equina syndrome. No urinary sxs- UA w/o infection or hematuria to indicate UTI/pyelo/nephrolithiasis. CT A/P w/o acute findings. Suspect muscle strain/spasm in setting of heavy lifting w/ reproducibility. She is taking diclofenac currently, will add on robaxin- discussed no driving/operating heavy machinery with this medicine as well as Lidoderm patches. PCP follow up.   Feeling improved s/p toradol, feels comfortable with discharge home.  I discussed results, treatment plan, need for follow-up, and return precautions with the patient. Provided opportunity for questions, patient confirmed understanding and is in agreement with plan.   Findings and plan of care discussed with supervising physician Dr. Pilar PlateBero who is in agreement.    Final Clinical Impressions(s) / ED Diagnoses   Final diagnoses:  Back pain  Vaginal bleeding    ED Discharge Orders         Ordered     methocarbamol (ROBAXIN) 500 MG tablet  Every 8 hours PRN     11/21/18 1628    lidocaine (LIDODERM) 5 %  Every 24 hours     11/21/18 1628           Cherly Andersonetrucelli, Robinette Esters R, PA-C 11/21/18 1633    Sabas SousBero, Michael M, MD 11/22/18 1457

## 2018-11-23 LAB — GC/CHLAMYDIA PROBE AMP (~~LOC~~) NOT AT ARMC
Chlamydia: NEGATIVE
Neisseria Gonorrhea: NEGATIVE

## 2019-02-20 ENCOUNTER — Emergency Department (HOSPITAL_BASED_OUTPATIENT_CLINIC_OR_DEPARTMENT_OTHER)

## 2019-02-20 ENCOUNTER — Other Ambulatory Visit: Payer: Self-pay

## 2019-02-20 ENCOUNTER — Encounter (HOSPITAL_BASED_OUTPATIENT_CLINIC_OR_DEPARTMENT_OTHER): Payer: Self-pay

## 2019-02-20 ENCOUNTER — Emergency Department (HOSPITAL_BASED_OUTPATIENT_CLINIC_OR_DEPARTMENT_OTHER)
Admission: EM | Admit: 2019-02-20 | Discharge: 2019-02-20 | Disposition: A | Discharge status: Home / Self Care | Attending: Emergency Medicine | Admitting: Emergency Medicine

## 2019-02-20 DIAGNOSIS — E119 Type 2 diabetes mellitus without complications: Secondary | ICD-10-CM | POA: Insufficient documentation

## 2019-02-20 DIAGNOSIS — Z7984 Long term (current) use of oral hypoglycemic drugs: Secondary | ICD-10-CM | POA: Diagnosis not present

## 2019-02-20 DIAGNOSIS — R109 Unspecified abdominal pain: Secondary | ICD-10-CM | POA: Insufficient documentation

## 2019-02-20 DIAGNOSIS — Z79899 Other long term (current) drug therapy: Secondary | ICD-10-CM | POA: Insufficient documentation

## 2019-02-20 DIAGNOSIS — J45909 Unspecified asthma, uncomplicated: Secondary | ICD-10-CM | POA: Diagnosis not present

## 2019-02-20 LAB — URINALYSIS, ROUTINE W REFLEX MICROSCOPIC
Bilirubin Urine: NEGATIVE
Glucose, UA: >=500 mg/dL — AB
Hgb urine dipstick: NEGATIVE
Ketones, ur: NEGATIVE mg/dL
Leukocytes,Ua: NEGATIVE
Nitrite: NEGATIVE
Protein, ur: NEGATIVE mg/dL
Specific Gravity, Urine: 1.025 (ref 1.005–1.030)
pH: 6 (ref 5.0–8.0)

## 2019-02-20 LAB — COMPREHENSIVE METABOLIC PANEL
ALT: 70 U/L — ABNORMAL HIGH (ref 0–44)
AST: 55 U/L — ABNORMAL HIGH (ref 15–41)
Albumin: 4 g/dL (ref 3.5–5.0)
Alkaline Phosphatase: 81 U/L (ref 38–126)
Anion gap: 10 (ref 5–15)
BUN: 13 mg/dL (ref 8–23)
CO2: 25 mmol/L (ref 22–32)
Calcium: 9.4 mg/dL (ref 8.9–10.3)
Chloride: 100 mmol/L (ref 98–111)
Creatinine, Ser: 0.66 mg/dL (ref 0.44–1.00)
GFR calc Af Amer: >60 mL/min (ref >60)
GFR calc non Af Amer: >60 mL/min (ref >60)
Glucose, Bld: 237 mg/dL — ABNORMAL HIGH (ref 70–99)
Potassium: 4 mmol/L (ref 3.5–5.1)
Sodium: 135 mmol/L (ref 135–145)
Total Bilirubin: 0.5 mg/dL (ref 0.3–1.2)
Total Protein: 8.1 g/dL (ref 6.5–8.1)

## 2019-02-20 LAB — URINALYSIS, MICROSCOPIC (REFLEX)

## 2019-02-20 LAB — CBC WITH DIFFERENTIAL/PLATELET
Abs Immature Granulocytes: 0.02 10*3/uL (ref 0.00–0.07)
Basophils Absolute: 0.1 10*3/uL (ref 0.0–0.1)
Basophils Relative: 1 %
Eosinophils Absolute: 0.4 10*3/uL (ref 0.0–0.5)
Eosinophils Relative: 5 %
HCT: 45.2 % (ref 36.0–46.0)
Hemoglobin: 14.9 g/dL (ref 12.0–15.0)
Immature Granulocytes: 0 %
Lymphocytes Relative: 42 %
Lymphs Abs: 3.3 10*3/uL (ref 0.7–4.0)
MCH: 29 pg (ref 26.0–34.0)
MCHC: 33 g/dL (ref 30.0–36.0)
MCV: 88.1 fL (ref 80.0–100.0)
Monocytes Absolute: 0.6 10*3/uL (ref 0.1–1.0)
Monocytes Relative: 8 %
Neutro Abs: 3.4 10*3/uL (ref 1.7–7.7)
Neutrophils Relative %: 44 %
Platelets: 289 10*3/uL (ref 150–400)
RBC: 5.13 MIL/uL — ABNORMAL HIGH (ref 3.87–5.11)
RDW: 12.7 % (ref 11.5–15.5)
WBC: 7.7 10*3/uL (ref 4.0–10.5)
nRBC: 0 % (ref 0.0–0.2)

## 2019-02-20 MED ORDER — ONDANSETRON HCL 4 MG/2ML IJ SOLN
4.0000 mg | Freq: Once | INTRAMUSCULAR | Status: AC
  Administered 2019-02-20: 4 mg via INTRAVENOUS
  Filled 2019-02-20: qty 2

## 2019-02-20 MED ORDER — NAPROXEN 500 MG PO TABS: 500.0000 mg | ORAL_TABLET | Freq: Two times a day (BID) | ORAL | 0 refills | Status: AC | PRN

## 2019-02-20 MED ORDER — MORPHINE SULFATE (PF) 4 MG/ML IV SOLN
4.0000 mg | Freq: Once | INTRAVENOUS | Status: AC
  Administered 2019-02-20: 13:00:00 4 mg via INTRAVENOUS
  Filled 2019-02-20: qty 1

## 2019-02-20 MED ORDER — LIDOCAINE 5 % EX PTCH: 1.0000 | MEDICATED_PATCH | CUTANEOUS | 0 refills | Status: AC

## 2019-02-20 MED ORDER — ONDANSETRON 4 MG PO TBDP: 4.0000 mg | ORAL_TABLET | Freq: Three times a day (TID) | ORAL | 0 refills | Status: AC | PRN

## 2019-02-20 NOTE — ED Triage Notes (Signed)
Pt c/o right flank pain, nausea x 3 days-denies fever v/d-NAD-steady gait

## 2019-02-20 NOTE — Discharge Instructions (Signed)
It was my pleasure taking care of you today!   Lidoderm patch and naproxen as needed for pain.  Zofran as needed for nausea.   Call your primary care doctor today to schedule a follow up appointment.   Return to ER for new or worsening symptoms, any additional concerns.

## 2019-02-20 NOTE — ED Notes (Signed)
ED Provider at bedside. 

## 2019-02-20 NOTE — ED Provider Notes (Signed)
[facility] EMERGENCY DEPARTMENT Provider Note   CSN: [id number] Arrival date & time: 02/20/19  1240     History Chief Complaint  Patient presents with  . Flank Pain    Denise Gallagher is a 64 y.o. female.  The history is provided by the patient and medical records. No language interpreter was used.  Flank Pain Pertinent negatives include no abdominal pain.   Denise Gallagher is a 64 y.o. female  with a PMH as listed below including previous hx of kidney stones, cholecystectomy and DM who presents to the Emergency Department complaining of right-sided back pain which radiates to the right flank. This began 3 days ago but got worse today. Associated with nausea, but no vomiting. No fevers. No rashes. No medications prior to arrival. No urinary symptoms. No chest pain or shortness of breath. No known injury.      Past Medical History:  Diagnosis Date  . Asthma   . Diabetes mellitus without complication ([place])   . Gall bladder inflammation   . Kidney stone   . Thyroid disease   . Vertigo     There are no problems to display for this patient.   Past Surgical History:  Procedure Laterality Date  . ABDOMINAL HYSTERECTOMY    . CHOLECYSTECTOMY    . KNEE SURGERY Bilateral      OB History   No obstetric history on file.     No family history on file.  Social History   Tobacco Use  . Smoking status: Never Smoker  . Smokeless tobacco: Never Used  Substance Use Topics  . Alcohol use: No  . Drug use: No    Home Medications Prior to Admission medications   Medication Sig Start Date End Date Taking? Authorizing Provider  albuterol (PROVENTIL HFA;VENTOLIN HFA) 108 (90 Base) MCG/ACT inhaler Inhale 1-2 puffs into the lungs every 6 (six) hours as needed for wheezing or shortness of breath. 02/08/16   Espina, [place], [place]  Ascorbic Acid (VITAMIN C CR) 500 MG CPCR Take 500 mg by mouth daily. 03/15/13   [provider]  benzonatate (TESSALON) 100 MG  capsule Take 1 capsule (100 mg total) by mouth 3 (three) times daily as needed for cough. 02/13/17   Lorin Glass, PA-C  Calcium Citrate-Vitamin D (CALCIUM + D PO) Take 1 tablet by mouth 4 (four) times a week.    [provider]  Cholecalciferol (D 2000) 2000 units TABS Take 2,000 Units by mouth daily. 03/15/13   [provider]  docusate sodium (COLACE) 100 MG capsule Take 1 capsule (100 mg total) by mouth every 12 (twelve) hours. 12/08/15   Long, Wonda Olds, MD  HYDROcodone-acetaminophen (NORCO/VICODIN) 5-325 MG tablet Take 2 tablets by mouth every 4 (four) hours as needed. 12/08/15   Long, Wonda Olds, MD  levothyroxine (SYNTHROID, LEVOTHROID) 25 MCG tablet Take 25 mcg by mouth daily before breakfast. 01/07/15   [provider]  lidocaine (LIDODERM) 5 % Place 1 patch onto the skin daily. Remove & Discard patch within 12 hours or as directed by MD 02/20/19   Algenis Ballin, Ozella Almond, PA-C  meclizine (ANTIVERT) 12.5 MG tablet Take 1 tablet (12.5 mg total) by mouth 3 (three) times daily as needed for dizziness. 03/04/15   Rancour, Annie Main, MD  metFORMIN (GLUCOPHAGE-XR) 500 MG 24 hr tablet Take 500 mg by mouth 2 (two) times daily. 01/07/15   [provider]  methocarbamol (ROBAXIN) 500 MG tablet Take 1 tablet (500 mg total) by mouth every  8 (eight) hours as needed. 11/21/18   Petrucelli, Samantha R, PA-C  naproxen (NAPROSYN) 500 MG tablet Take 1 tablet (500 mg total) by mouth 2 (two) times daily as needed for mild pain or moderate pain. 02/20/19   Heber Hoog, Chase Picket, PA-C  omeprazole (PRILOSEC) 40 MG capsule Take 40 mg by mouth daily. 11/27/14   [provider]  ondansetron (ZOFRAN ODT) 4 MG disintegrating tablet Take 1 tablet (4 mg total) by mouth every 8 (eight) hours as needed for nausea or vomiting. 02/20/19   Seydou Hearns, Chase Picket, PA-C  oxyCODONE-acetaminophen (PERCOCET) 5-325 MG per tablet Take 1-2 tablets by mouth every 6 (six) hours as needed for severe pain.  06/11/14   Hess, Nada Boozer, PA-C  predniSONE (DELTASONE) 10 MG tablet Take 2 tablets (20 mg total) by mouth daily. 02/08/16   Alvina Chou, PA  tamsulosin (FLOMAX) 0.4 MG CAPS capsule Take 0.4 mg by mouth daily as needed (for urine flow).  12/25/14   [provider]  traMADol (ULTRAM) 50 MG tablet Take 50 mg by mouth every morning. For cough 02/17/15   [provider]    Allergies    Levofloxacin and Zolpidem tartrate  Review of Systems   Review of Systems  Gastrointestinal: Positive for nausea. Negative for abdominal pain, diarrhea and vomiting.  Genitourinary: Positive for flank pain. Negative for difficulty urinating, dysuria, frequency and urgency.  Musculoskeletal: Positive for back pain.  All other systems reviewed and are negative.   Physical Exam Updated Vital Signs BP 135/80 (BP Location: Right Arm)   Pulse 65   Temp 98.7 F (37.1 C) (Oral)   Resp 16   Ht 5\' 4"  (1.626 m)   Wt 95.3 kg   SpO2 97%   BMI 36.05 kg/m   Physical Exam Vitals and nursing note reviewed.  Constitutional:      General: She is not in acute distress.    Appearance: She is well-developed.  HENT:     Head: Normocephalic and atraumatic.  Cardiovascular:     Rate and Rhythm: Normal rate and regular rhythm.     Heart sounds: Normal heart sounds. No murmur.  Pulmonary:     Effort: Pulmonary effort is normal. No respiratory distress.     Breath sounds: Normal breath sounds.  Abdominal:     General: There is no distension.     Palpations: Abdomen is soft.     Comments: Tenderness to the right flank without overlying skin changes or rash. No rebound or guarding. No CVA tenderness.  Musculoskeletal:     Cervical back: Neck supple.  Skin:    General: Skin is warm and dry.  Neurological:     Mental Status: She is alert and oriented to person, place, and time.     ED Results / Procedures / Treatments   Labs (all labs ordered are listed, but only abnormal results are  displayed) Labs Reviewed  COMPREHENSIVE METABOLIC PANEL - Abnormal; Notable for the following components:      Result Value   Glucose, Bld 237 (*)    AST 55 (*)    ALT 70 (*)    All other components within normal limits  CBC WITH DIFFERENTIAL/PLATELET - Abnormal; Notable for the following components:   RBC 5.13 (*)    All other components within normal limits  URINALYSIS, ROUTINE W REFLEX MICROSCOPIC - Abnormal; Notable for the following components:   APPearance HAZY (*)    Glucose, UA >=500 (*)    All other components  within normal limits  URINALYSIS, MICROSCOPIC (REFLEX) - Abnormal; Notable for the following components:   Bacteria, UA FEW (*)    All other components within normal limits    EKG None  Radiology CT Renal Stone Study  Result Date: 02/20/2019 CLINICAL DATA:  Right flank pain for 2 days EXAM: CT ABDOMEN AND PELVIS WITHOUT CONTRAST TECHNIQUE: Multidetector CT imaging of the abdomen and pelvis was performed following the standard protocol without IV contrast. COMPARISON:  11/21/2018 FINDINGS: Lower chest: No acute pleural or parenchymal lung disease. Hepatobiliary: Diffuse fatty infiltration of the liver, stable. No focal parenchymal liver abnormalities on this unenhanced exam. Gallbladder is surgically absent. No biliary dilation. Pancreas: Unremarkable. No pancreatic ductal dilatation or surrounding inflammatory changes. Spleen: Normal in size without focal abnormality. Adrenals/Urinary Tract: Adrenals are unremarkable. There are 2 nonobstructing left-sided renal calculi largest measuring 5 mm reference image 38 of series 2, stable. No left-sided obstructive uropathy. Right kidney is unremarkable with no evidence of urinary tract calculus or obstruction. Stomach/Bowel: No evidence of bowel obstruction or ileus. There is a normal appendix in the right lower quadrant. Vascular/Lymphatic: Evaluation of vascular structures limited without IV contrast. No pathologic adenopathy within  the abdomen or pelvis. Reproductive: Status post hysterectomy. No adnexal masses. Other: No abdominal wall hernia or abnormality. No abdominopelvic ascites. Musculoskeletal: Stable spondylosis of the lumbosacral junction. Reconstructed images demonstrate no additional findings. IMPRESSION: 1. Stable nonobstructing left renal calculi. No evidence of obstructive uropathy within either kidney. 2. Stable fatty infiltration of the liver. 3. Otherwise unremarkable unenhanced exam.  Normal appendix. Electronically Signed   By: Sharlet Salina M.D.   On: 02/20/2019 13:57    Procedures Procedures (including critical care time)  Medications Ordered in ED Medications  ondansetron (ZOFRAN) injection 4 mg (4 mg Intravenous Given 02/20/19 1327)  morphine 4 MG/ML injection 4 mg (4 mg Intravenous Given 02/20/19 1329)    ED Course  I have reviewed the triage vital signs and the nursing notes.  Pertinent labs & imaging results that were available during my care of the patient were reviewed by me and considered in my medical decision making (see chart for details).    MDM Rules/Calculators/A&P                      Denise Gallagher is a 64 y.o. female who presents to ED for right flank pain x 3 days associated with nausea. On exam, patient is afebrile, hemodynamically stable with tenderness to the right flank. No overlying skin changes to suggest shingles, however area of concern does appear quite dermatomal and this is possible. She has had prior cholecystectomy therefore not acute cholecystitis.Discussed this with patient. UA without blood or signs of infection. Labs reviewed and overall reassuring. Mild bump in AST and ALT noted. Hyperglycemia without evidence of DKA. CT shows stable nonobstructing left renal calculi as well as stable fatty liver. No other findings noted. Patient feels improved after symptomatic mgt in ED. Evaluation does not show pathology that would require ongoing emergent intervention or inpatient  treatment. Encouraged PCP follow up. Reasons to return to ER were discussed. All questions answered.    Final Clinical Impression(s) / ED Diagnoses Final diagnoses:  Flank pain    Rx / DC Orders ED Discharge Orders         Ordered    naproxen (NAPROSYN) 500 MG tablet  2 times daily PRN     02/20/19 1429    ondansetron (ZOFRAN ODT) 4 MG disintegrating tablet  Every 8 hours PRN     02/20/19 1429    lidocaine (LIDODERM) 5 %  Every 24 hours     02/20/19 1429           Zackary Mckeone, Chase Picket, PA-C 02/20/19 1550    Long, Arlyss Repress, MD 02/21/19 [phone]

## 2021-01-26 ENCOUNTER — Other Ambulatory Visit: Payer: Self-pay

## 2021-01-26 ENCOUNTER — Encounter (HOSPITAL_BASED_OUTPATIENT_CLINIC_OR_DEPARTMENT_OTHER): Payer: Self-pay

## 2021-01-26 ENCOUNTER — Emergency Department (HOSPITAL_BASED_OUTPATIENT_CLINIC_OR_DEPARTMENT_OTHER)
Admission: EM | Admit: 2021-01-26 | Discharge: 2021-01-26 | Disposition: A | Discharge status: Home / Self Care | Attending: Emergency Medicine | Admitting: Emergency Medicine

## 2021-01-26 ENCOUNTER — Emergency Department (HOSPITAL_BASED_OUTPATIENT_CLINIC_OR_DEPARTMENT_OTHER)

## 2021-01-26 DIAGNOSIS — W01198A Fall on same level from slipping, tripping and stumbling with subsequent striking against other object, initial encounter: Secondary | ICD-10-CM | POA: Diagnosis not present

## 2021-01-26 DIAGNOSIS — M542 Cervicalgia: Secondary | ICD-10-CM | POA: Insufficient documentation

## 2021-01-26 DIAGNOSIS — S0990XA Unspecified injury of head, initial encounter: Secondary | ICD-10-CM

## 2021-01-26 DIAGNOSIS — S0083XA Contusion of other part of head, initial encounter: Secondary | ICD-10-CM | POA: Insufficient documentation

## 2021-01-26 DIAGNOSIS — S63501A Unspecified sprain of right wrist, initial encounter: Secondary | ICD-10-CM

## 2021-01-26 MED ORDER — IBUPROFEN 200 MG PO TABS
600.0000 mg | ORAL_TABLET | Freq: Once | ORAL | Status: AC
  Administered 2021-01-26: 600 mg via ORAL
  Filled 2021-01-26: qty 1

## 2021-01-26 NOTE — Discharge Instructions (Signed)
Please read and follow all provided instructions.  Your diagnoses today include:  1. Contusion of face, initial encounter   2. Minor head injury, initial encounter   3. Sprain of right wrist, initial encounter     Tests performed today include: CT scan of your head, face, and neck: no signs of broken bones or other problems X-ray of your wrist and forearm: no signs of broken bones Vital signs. See below for your results today.   Medications prescribed:  None  Take any prescribed medications only as directed.  Home care instructions:  Follow any educational materials contained in this packet.  BE VERY CAREFUL not to take multiple medicines containing Tylenol (also called acetaminophen). Doing so can lead to an overdose which can damage your liver and cause liver failure and possibly death.   Follow-up instructions: Please follow-up with your primary care provider in the next 7 days for recheck if not feeling better.   Return instructions:  SEEK IMMEDIATE MEDICAL ATTENTION IF: There is confusion or drowsiness (although children frequently become drowsy after injury).  You cannot awaken the injured person.  You have more than one episode of vomiting.  You notice dizziness or unsteadiness which is getting worse, or inability to walk.  You have convulsions or unconsciousness.  You experience severe, persistent headaches not relieved by Tylenol. You cannot use arms or legs normally.  There are changes in pupil sizes. (This is the black center in the colored part of the eye)  There is clear or bloody discharge from the nose or ears.  You have change in speech, vision, swallowing, or understanding.  Localized weakness, numbness, tingling, or change in bowel or bladder control. You have any other emergent concerns.  Additional Information: You have had a head injury which does not appear to require admission at this time.  Your vital signs today were: BP (!) 166/86 (BP Location:  Left Arm)    Pulse 80    Temp 97.8 F (36.6 C) (Oral)    Resp 18    Ht 5\' 4"  (1.626 m)    Wt 94.8 kg    SpO2 100%    BMI 35.87 kg/m  If your blood pressure (BP) was elevated above 135/85 this visit, please have this repeated by your doctor within one month. --------------

## 2021-01-26 NOTE — ED Provider Notes (Signed)
[facility] EMERGENCY DEPARTMENT Provider Note   CSN: [id number] Arrival date & time: 01/26/21  1025     History  Chief Complaint  Patient presents with   Denise Gallagher    Denise Gallagher is a 66 y.o. female.  Patient presents the emergency department for evaluation of injury sustained after a fall occurring just prior to arrival.  Patient was trying to move and avoid a shopping cart when she lost her balance.  She fell onto an outstretched right arm and struck her left face on the ground.  She currently complains of right-sided facial pain, headache, neck discomfort, right forearm and wrist pain.  She denies injury to her lower extremities.  No chest pain or abdominal pain.  No weakness, numbness, or tingling in her arms or legs.  No treatments prior to arrival.  No history of anticoagulation.  She did not lose consciousness and denies vomiting or confusion. The onset of this condition was acute. The course is constant. Aggravating factors: movement. Alleviating factors: none.        Home Medications Prior to Admission medications   Medication Sig Start Date End Date Taking? Authorizing Provider  albuterol (PROVENTIL HFA;VENTOLIN HFA) 108 (90 Base) MCG/ACT inhaler Inhale 1-2 puffs into the lungs every 6 (six) hours as needed for wheezing or shortness of breath. 02/08/16   Espina, [place], [place]  Ascorbic Acid (VITAMIN C CR) 500 MG CPCR Take 500 mg by mouth daily. 03/15/13   [provider]  benzonatate (TESSALON) 100 MG capsule Take 1 capsule (100 mg total) by mouth 3 (three) times daily as needed for cough. 02/13/17   Cristina Gong, PA-C  Calcium Citrate-Vitamin D (CALCIUM + D PO) Take 1 tablet by mouth 4 (four) times a week.    [provider]  Cholecalciferol (D 2000) 2000 units TABS Take 2,000 Units by mouth daily. 03/15/13   [provider]  docusate sodium (COLACE) 100 MG capsule Take 1 capsule (100 mg total) by mouth every 12 (twelve) hours.  12/08/15   Long, Arlyss Repress, MD  HYDROcodone-acetaminophen (NORCO/VICODIN) 5-325 MG tablet Take 2 tablets by mouth every 4 (four) hours as needed. 12/08/15   Long, Arlyss Repress, MD  levothyroxine (SYNTHROID, LEVOTHROID) 25 MCG tablet Take 25 mcg by mouth daily before breakfast. 01/07/15   [provider]  lidocaine (LIDODERM) 5 % Place 1 patch onto the skin daily. Remove & Discard patch within 12 hours or as directed by MD 02/20/19   Ward, Chase Picket, PA-C  meclizine (ANTIVERT) 12.5 MG tablet Take 1 tablet (12.5 mg total) by mouth 3 (three) times daily as needed for dizziness. 03/04/15   Rancour, Jeannett Senior, MD  metFORMIN (GLUCOPHAGE-XR) 500 MG 24 hr tablet Take 500 mg by mouth 2 (two) times daily. 01/07/15   [provider]  methocarbamol (ROBAXIN) 500 MG tablet Take 1 tablet (500 mg total) by mouth every 8 (eight) hours as needed. 11/21/18   Petrucelli, Samantha R, PA-C  naproxen (NAPROSYN) 500 MG tablet Take 1 tablet (500 mg total) by mouth 2 (two) times daily as needed for mild pain or moderate pain. 02/20/19   Ward, Chase Picket, PA-C  omeprazole (PRILOSEC) 40 MG capsule Take 40 mg by mouth daily. 11/27/14   [provider]  ondansetron (ZOFRAN ODT) 4 MG disintegrating tablet Take 1 tablet (4 mg total) by mouth every 8 (eight) hours as needed for nausea or vomiting. 02/20/19   Ward, Chase Picket, PA-C  oxyCODONE-acetaminophen (PERCOCET) 5-325 MG per tablet Take  1-2 tablets by mouth every 6 (six) hours as needed for severe pain. 06/11/14   Hess, Nada Boozerobyn M, PA-C  predniSONE (DELTASONE) 10 MG tablet Take 2 tablets (20 mg total) by mouth daily. 02/08/16   Alvina ChouEspina, Francisco Manuel, PA  tamsulosin (FLOMAX) 0.4 MG CAPS capsule Take 0.4 mg by mouth daily as needed (for urine flow).  12/25/14   [provider]  traMADol (ULTRAM) 50 MG tablet Take 50 mg by mouth every morning. For cough 02/17/15   [provider]      Allergies    Levofloxacin and Zolpidem tartrate    Review  of Systems   Review of Systems  Constitutional:  Negative for fatigue.  HENT:  Positive for facial swelling. Negative for tinnitus.   Eyes:  Negative for photophobia, pain and visual disturbance.  Respiratory:  Negative for shortness of breath.   Cardiovascular:  Negative for chest pain.  Gastrointestinal:  Negative for nausea and vomiting.  Musculoskeletal:  Positive for arthralgias, joint swelling and neck pain. Negative for back pain and gait problem.  Skin:  Negative for wound.  Neurological:  Positive for headaches. Negative for dizziness, weakness, light-headedness and numbness.  Psychiatric/Behavioral:  Negative for confusion and decreased concentration.    Physical Exam Updated Vital Signs BP (!) 166/86 (BP Location: Left Arm)    Pulse 80    Temp 97.8 F (36.6 C) (Oral)    Resp 18    Ht 5\' 4"  (1.626 m)    Wt 94.8 kg    SpO2 100%    BMI 35.87 kg/m  Physical Exam Vitals and nursing note reviewed.  Constitutional:      Appearance: She is well-developed.  HENT:     Head: Normocephalic. No raccoon eyes or Battle's sign.     Comments: Patient with tenderness over the left maxilla and zygoma.  No jaw pain.  No obvious dental injury.  No malocclusion.    Right Ear: Tympanic membrane, ear canal and external ear normal. No hemotympanum.     Left Ear: Tympanic membrane, ear canal and external ear normal. No hemotympanum.     Nose: Nose normal.     Mouth/Throat:     Pharynx: Uvula midline.  Eyes:     General: Lids are normal.     Extraocular Movements:     Right eye: No nystagmus.     Left eye: No nystagmus.     Conjunctiva/sclera: Conjunctivae normal.     Pupils: Pupils are equal, round, and reactive to light.     Comments: No visible hyphema noted  Neck:     Comments: Cervical midline and paraspinous tenderness without step-offs. Cardiovascular:     Rate and Rhythm: Normal rate and regular rhythm.  Pulmonary:     Effort: Pulmonary effort is normal.     Breath sounds: Normal  breath sounds.  Abdominal:     Palpations: Abdomen is soft.     Tenderness: There is no abdominal tenderness.  Musculoskeletal:     Cervical back: Normal range of motion and neck supple. Tenderness present. No bony tenderness.     Thoracic back: No tenderness or bony tenderness.     Lumbar back: No tenderness or bony tenderness.     Comments: Right upper extremity: Patient has tenderness across the dorsal aspect of the wrist and proximal metacarpals.  Tenderness extends proximally to the mid forearm.  No significant swelling or deformities.  Decreased range of motion in flexion/extension, lateral bending of the wrist.  Skin:  General: Skin is warm and dry.  Neurological:     Mental Status: She is alert and oriented to person, place, and time.     GCS: GCS eye subscore is 4. GCS verbal subscore is 5. GCS motor subscore is 6.     Cranial Nerves: No cranial nerve deficit.     Sensory: No sensory deficit.     Coordination: Coordination normal.    ED Results / Procedures / Treatments   Labs (all labs ordered are listed, but only abnormal results are displayed) Labs Reviewed - No data to display  EKG None  Radiology DG Forearm Right  Result Date: 01/26/2021 CLINICAL DATA:  Status post fall from standing.  Wrist pain. EXAM: RIGHT WRIST - COMPLETE 3+ VIEW; RIGHT FOREARM - 2 VIEW COMPARISON:  None. FINDINGS: No acute fracture or dislocation. No aggressive osseous lesion. Normal alignment. Soft tissue are unremarkable. No radiopaque foreign body or soft tissue emphysema. IMPRESSION: 1.  No acute osseous injury of the right forearm. 2.  No acute osseous injury of the right wrist. Electronically Signed   By: Kathreen Devoid M.D.   On: 01/26/2021 11:30   DG Wrist Complete Right  Result Date: 01/26/2021 CLINICAL DATA:  Status post fall from standing.  Wrist pain. EXAM: RIGHT WRIST - COMPLETE 3+ VIEW; RIGHT FOREARM - 2 VIEW COMPARISON:  None. FINDINGS: No acute fracture or dislocation. No  aggressive osseous lesion. Normal alignment. Soft tissue are unremarkable. No radiopaque foreign body or soft tissue emphysema. IMPRESSION: 1.  No acute osseous injury of the right forearm. 2.  No acute osseous injury of the right wrist. Electronically Signed   By: Kathreen Devoid M.D.   On: 01/26/2021 11:30   CT Head Wo Contrast  Result Date: 01/26/2021 CLINICAL DATA:  Trauma to the head, face and neck. Fell from standing position with trauma to the right. EXAM: CT HEAD WITHOUT CONTRAST CT MAXILLOFACIAL WITHOUT CONTRAST CT CERVICAL SPINE WITHOUT CONTRAST TECHNIQUE: Multidetector CT imaging of the head, cervical spine, and maxillofacial structures were performed using the standard protocol without intravenous contrast. Multiplanar CT image reconstructions of the cervical spine and maxillofacial structures were also generated. COMPARISON:  03/04/2015 FINDINGS: CT HEAD FINDINGS Brain: Normal appearance without evidence of accelerated atrophy, old or acute infarction, mass lesion, hemorrhage, hydrocephalus or extra-axial collection. Vascular: Abnormal vascular finding. Skull: No skull fracture. Other: Incidental arachnoid herniation into the sella. CT MAXILLOFACIAL FINDINGS Osseous: No facial fracture.  No focal bone lesion. Orbits: No evidence of orbital injury or other abnormal finding by CT. Sinuses: Clear.  No inflammatory or traumatic findings. Soft tissues: No significant soft tissue injury identified. CT CERVICAL SPINE FINDINGS Alignment: No malalignment. Skull base and vertebrae: No fracture or focal bone lesion. 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 Mild facet osteoarthritis on the left at C4-5. Ordinary mild spondylosis at C5-6 with small endplate osteophytes but no apparent compressive narrowing of the canal or foramina. Upper chest: Negative Other: None IMPRESSION: Head CT: No acute or  traumatic finding.  Normal for age. Maxillofacial CT: No acute or traumatic finding. Cervical spine CT: No acute or traumatic finding.  Mild spondylosis. Electronically Signed   By: Nelson Chimes M.D.   On: 01/26/2021 11:27   CT Cervical Spine Wo Contrast  Result Date: 01/26/2021 CLINICAL DATA:  Trauma to the head, face and neck. Fell from standing position with trauma to the right. EXAM: CT HEAD WITHOUT  CONTRAST CT MAXILLOFACIAL WITHOUT CONTRAST CT CERVICAL SPINE WITHOUT CONTRAST TECHNIQUE: Multidetector CT imaging of the head, cervical spine, and maxillofacial structures were performed using the standard protocol without intravenous contrast. Multiplanar CT image reconstructions of the cervical spine and maxillofacial structures were also generated. COMPARISON:  03/04/2015 FINDINGS: CT HEAD FINDINGS Brain: Normal appearance without evidence of accelerated atrophy, old or acute infarction, mass lesion, hemorrhage, hydrocephalus or extra-axial collection. Vascular: Abnormal vascular finding. Skull: No skull fracture. Other: Incidental arachnoid herniation into the sella. CT MAXILLOFACIAL FINDINGS Osseous: No facial fracture.  No focal bone lesion. Orbits: No evidence of orbital injury or other abnormal finding by CT. Sinuses: Clear.  No inflammatory or traumatic findings. Soft tissues: No significant soft tissue injury identified. CT CERVICAL SPINE FINDINGS Alignment: No malalignment. Skull base and vertebrae: No fracture or focal bone lesion. 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 Mild facet osteoarthritis on the left at C4-5. Ordinary mild spondylosis at C5-6 with small endplate osteophytes but no apparent compressive narrowing of the canal or foramina. Upper chest: Negative Other: None IMPRESSION: Head CT: No acute or traumatic finding.  Normal for age. Maxillofacial CT: No acute or traumatic  finding. Cervical spine CT: No acute or traumatic finding.  Mild spondylosis. Electronically Signed   By: Nelson Chimes M.D.   On: 01/26/2021 11:27   CT Maxillofacial Wo Contrast  Result Date: 01/26/2021 CLINICAL DATA:  Trauma to the head, face and neck. Fell from standing position with trauma to the right. EXAM: CT HEAD WITHOUT CONTRAST CT MAXILLOFACIAL WITHOUT CONTRAST CT CERVICAL SPINE WITHOUT CONTRAST TECHNIQUE: Multidetector CT imaging of the head, cervical spine, and maxillofacial structures were performed using the standard protocol without intravenous contrast. Multiplanar CT image reconstructions of the cervical spine and maxillofacial structures were also generated. COMPARISON:  03/04/2015 FINDINGS: CT HEAD FINDINGS Brain: Normal appearance without evidence of accelerated atrophy, old or acute infarction, mass lesion, hemorrhage, hydrocephalus or extra-axial collection. Vascular: Abnormal vascular finding. Skull: No skull fracture. Other: Incidental arachnoid herniation into the sella. CT MAXILLOFACIAL FINDINGS Osseous: No facial fracture.  No focal bone lesion. Orbits: No evidence of orbital injury or other abnormal finding by CT. Sinuses: Clear.  No inflammatory or traumatic findings. Soft tissues: No significant soft tissue injury identified. CT CERVICAL SPINE FINDINGS Alignment: No malalignment. Skull base and vertebrae: No fracture or focal bone lesion. 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 Mild facet osteoarthritis on the left at C4-5. Ordinary mild spondylosis at C5-6 with small endplate osteophytes but no apparent compressive narrowing of the canal or foramina. Upper chest: Negative Other: None IMPRESSION: Head CT: No acute or traumatic finding.  Normal for age. Maxillofacial CT: No acute or traumatic finding. Cervical spine CT: No acute or traumatic finding.  Mild spondylosis.  Electronically Signed   By: Nelson Chimes M.D.   On: 01/26/2021 11:27    Procedures Procedures    Medications Ordered in ED Medications  ibuprofen (ADVIL) tablet 600 mg (has no administration in time range)    ED Course/ Medical Decision Making/ A&P    Patient seen and examined. Plan discussed with patient.   Labs: none  Imaging: Due to head injury and pain, will obtain CT of the head, maxillofacial bones and cervical spine.  Will obtain plain film x-rays of the lright wrist and forearm.   Medications/Fluids: PO ibuprofen  if head CT negative  Vital signs reviewed and are as follows: BP (!) 166/86 (BP Location: Left Arm)    Pulse 80    Temp 97.8 F (36.6 C) (Oral)    Resp 18    Ht 5\' 4"  (1.626 m)    Wt 94.8 kg    SpO2 100%    BMI 35.87 kg/m   Initial impression: Mechanical fall, head injury, facial injury, rule out right wrist fracture  11:51 AM On re-exam patient appears stable.  Discussed pertinent results with patient at bedside. This included reassuring CT imaging and x-rays. They are comfortable with discharge at this time.   Home treatment: We will provide with wrist splint.  Counseled to use tylenol and ibuprofen for supportive treatment.  Discussed rice protocol.   Follow-up: Encouraged patient to follow-up with their provider if symptoms persist for 7 days.  Discussed possibility of occult nondisplaced fracture.  Patient verbalized understanding and agreed with plan.                             Medical Decision Making  Patient with mechanical fall today. She fell and hit her head.  Imaging of the head, neck and cervical spine were negative.  She also landed on her right wrist and forearm, plain film imaging negative for acute fracture.  Discussed with patient the possibility of occult fracture and need for recheck in 1 week if she continues to have significant pain or difficulty using her hand or arm.  Upper extremity is neurovascularly intact with normal distal CMS.   No signs of compartment syndrome.         Final Clinical Impression(s) / ED Diagnoses Final diagnoses:  Contusion of face, initial encounter  Minor head injury, initial encounter  Sprain of right wrist, initial encounter    Rx / DC Orders ED Discharge Orders     None         Carlisle Cater, PA-C 01/26/21 [place], [place], DO 01/26/21 1405

## 2021-01-26 NOTE — ED Triage Notes (Signed)
Pt was moving out of the way of a shopping cart and fell and hit right side of face and right wrist. Denies blood thinner use. Swelling and redness to face, c/o dizziness after fall.

## 2021-08-20 ENCOUNTER — Other Ambulatory Visit: Payer: Self-pay | Admitting: Nephrology

## 2021-08-20 DIAGNOSIS — N200 Calculus of kidney: Secondary | ICD-10-CM

## 2021-08-20 DIAGNOSIS — E875 Hyperkalemia: Secondary | ICD-10-CM

## 2021-08-20 DIAGNOSIS — R8290 Unspecified abnormal findings in urine: Secondary | ICD-10-CM

## 2021-08-20 DIAGNOSIS — E785 Hyperlipidemia, unspecified: Secondary | ICD-10-CM

## 2022-08-19 ENCOUNTER — Other Ambulatory Visit: Payer: Self-pay | Admitting: Nephrology

## 2022-08-19 DIAGNOSIS — N200 Calculus of kidney: Secondary | ICD-10-CM

## 2022-08-19 DIAGNOSIS — E875 Hyperkalemia: Secondary | ICD-10-CM

## 2022-08-19 DIAGNOSIS — R8290 Unspecified abnormal findings in urine: Secondary | ICD-10-CM

## 2022-08-19 DIAGNOSIS — E785 Hyperlipidemia, unspecified: Secondary | ICD-10-CM

## 2023-07-01 IMAGING — CT CT HEAD W/O CM
4 series · 16 of 47 positions shown, 18 images · non-contrast
Comparison: 03/04/2015

CLINICAL DATA: Trauma to the head, face and neck. Fell from
standing position with trauma to the right.

EXAM:
CT HEAD WITHOUT CONTRAST
CT MAXILLOFACIAL WITHOUT CONTRAST
CT CERVICAL SPINE WITHOUT CONTRAST
TECHNIQUE: Multidetector CT imaging of the head, cervical spine, and
maxillofacial structures were performed using the standard protocol
without intravenous contrast. Multiplanar CT image reconstructions
of the cervical spine and maxillofacial structures were also
generated.

[Series 2: head wo · axial · 0.40mm/px · z∈[-170,-55]mm · 7 of 31 slices shown, 9 images]
[im 4/31  brain]
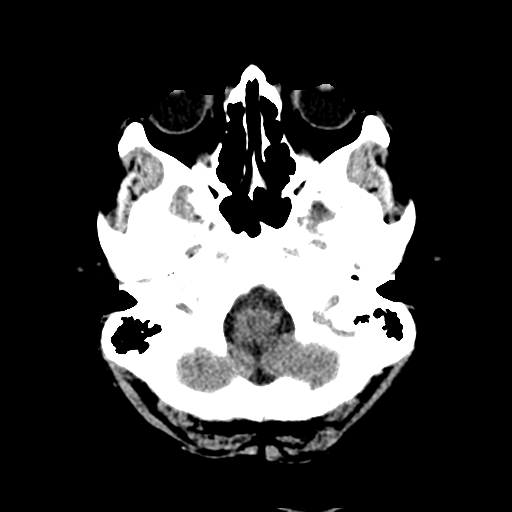
[im 4/31  bone]
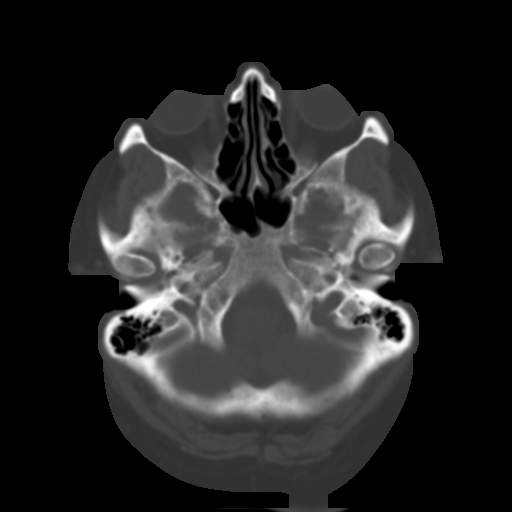
[im 8/31  brain]
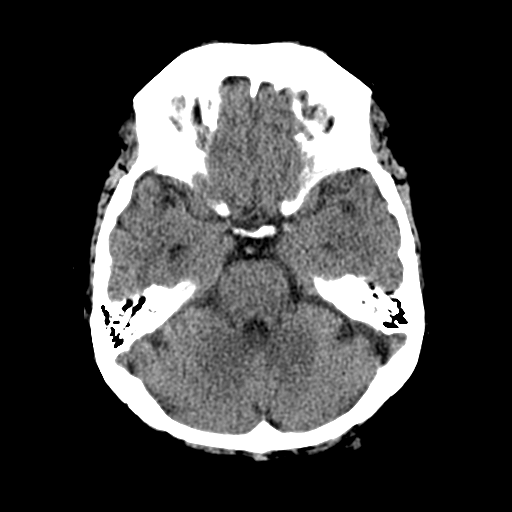
[im 12/31  brain]
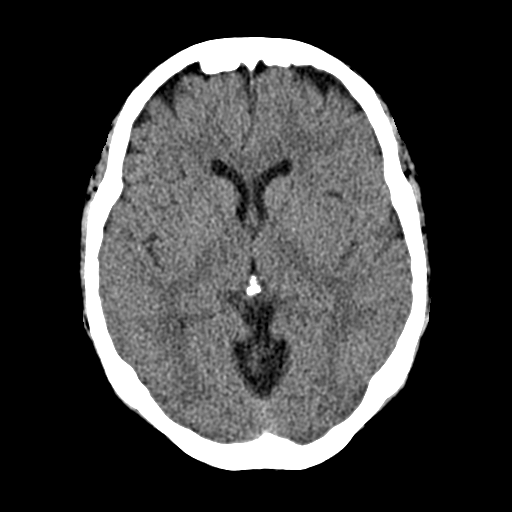
[im 16/31  brain]
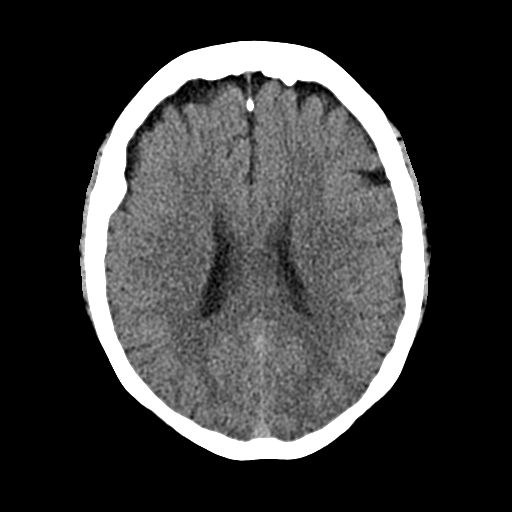
[im 19/31  brain]
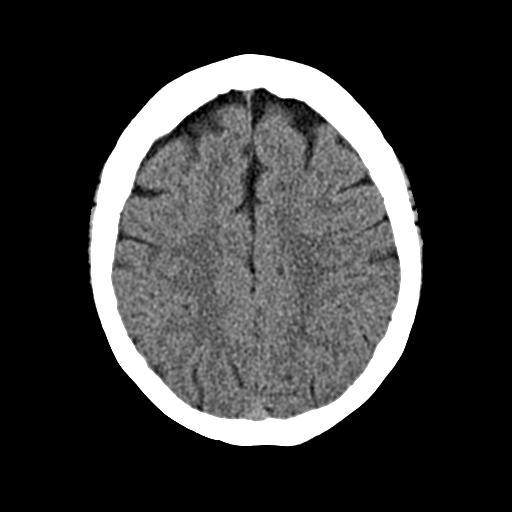
[im 19/31  bone]
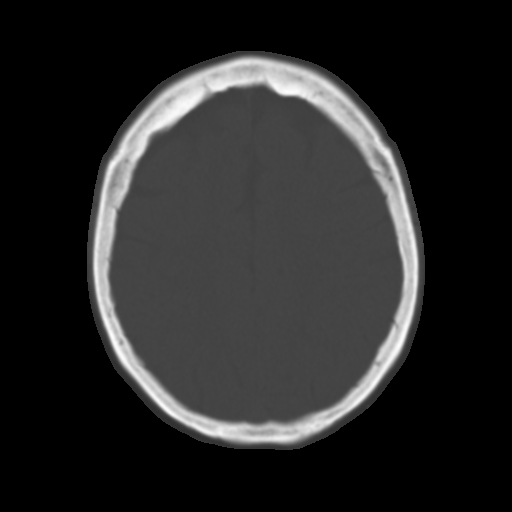
[im 23/31  brain]
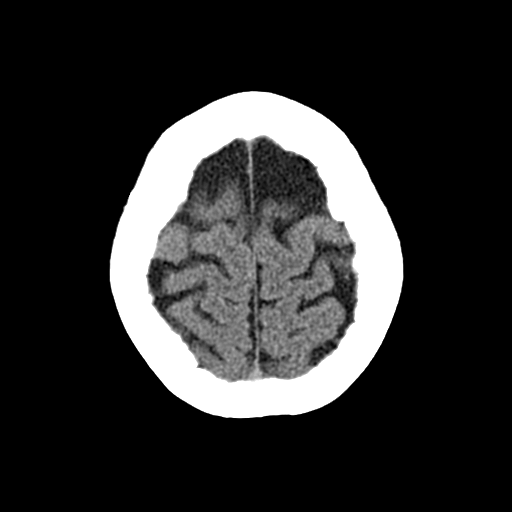
[im 27/31  brain]
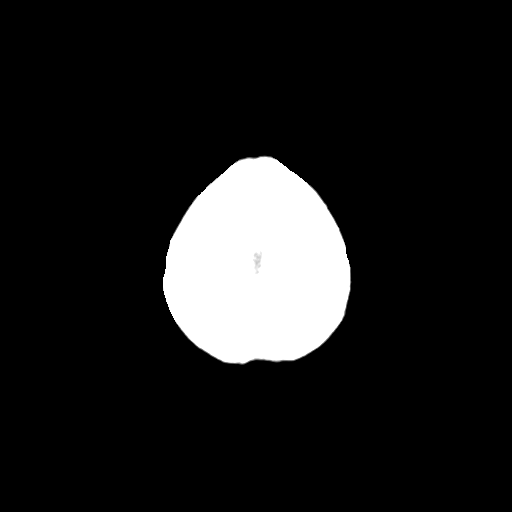

[Series 3: head bone · axial · 0.40mm/px · z∈[-171,-139]mm · 3 of 78 slices shown]
[im 8/78  bone]
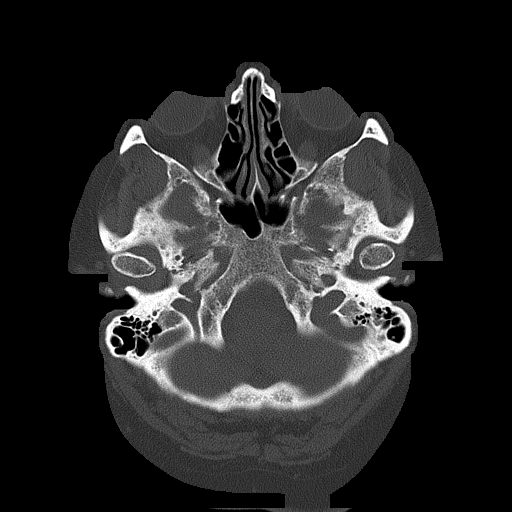
[im 16/78  bone]
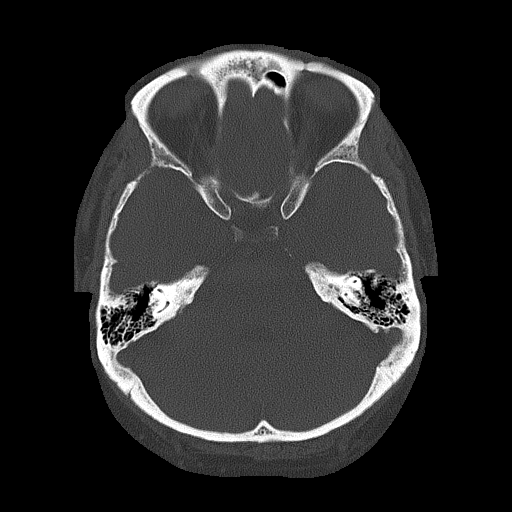
[im 24/78  bone]
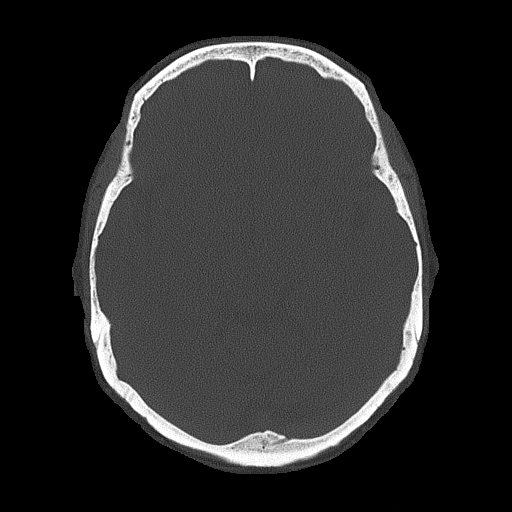

[Series 4: head coronal · coronal · 0.30mm/px · 3 of 63 slices shown]
[im 21/63  brain]
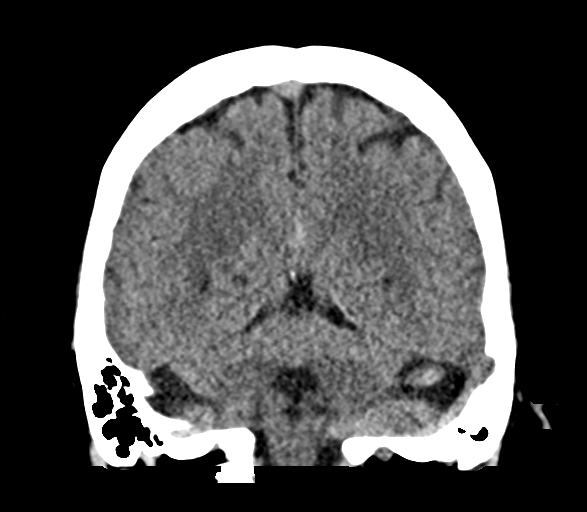
[im 28/63  brain]
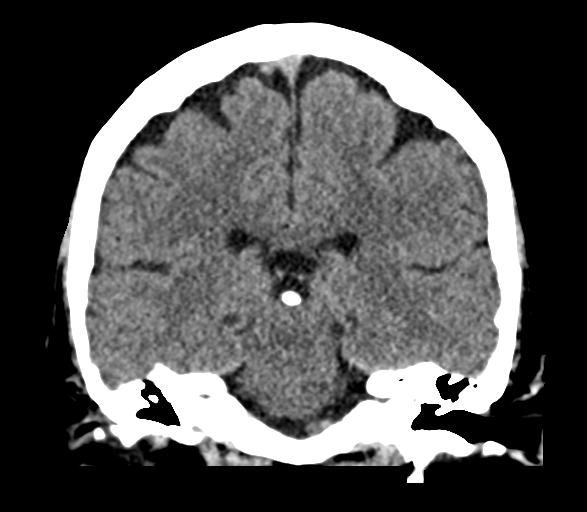
[im 35/63  brain]
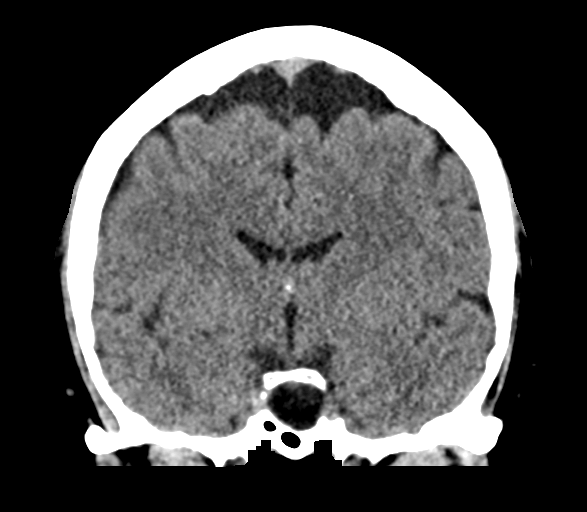

[Series 5: head sagittal · sagittal · 0.32mm/px · 3 of 55 slices shown]
[im 19/55  brain]
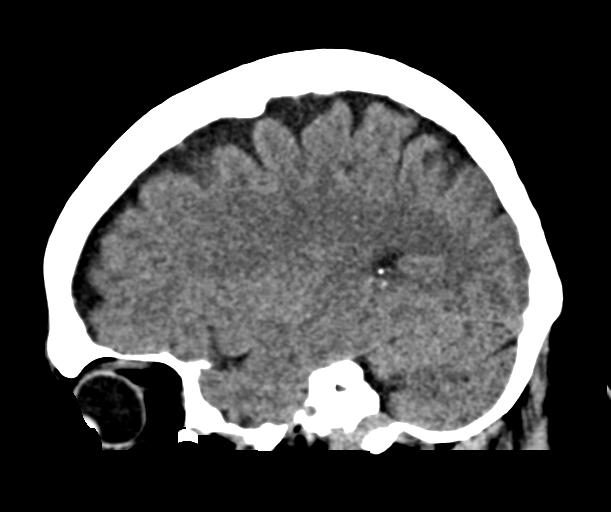
[im 28/55  brain]
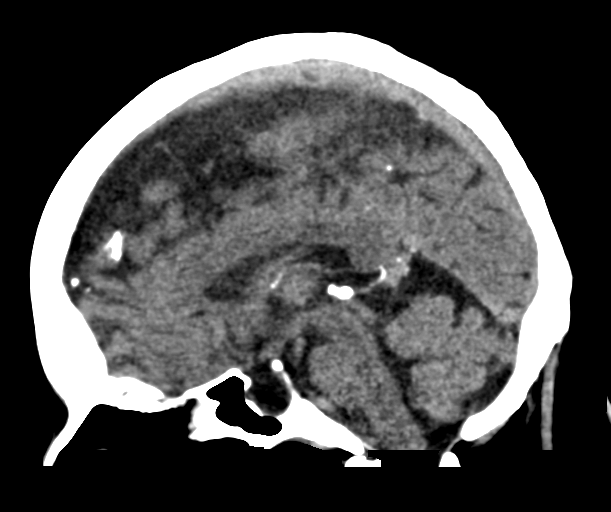
[im 37/55  brain]
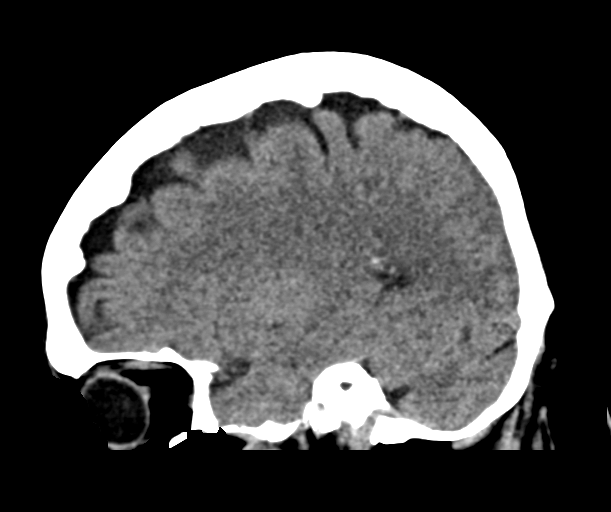

[16 of 47 positions shown; findings below may reference images not displayed]

FINDINGS: CT HEAD FINDINGS

Brain: Normal appearance without evidence of accelerated atrophy,
old or acute infarction, mass lesion, hemorrhage, hydrocephalus or
extra-axial collection.

Vascular: Abnormal vascular finding.

Skull: No skull fracture.

Other: Incidental arachnoid herniation into the sella.

CT MAXILLOFACIAL FINDINGS

Osseous: No facial fracture.  No focal bone lesion.

Orbits: No evidence of orbital injury or other abnormal finding by
CT.

Sinuses: Clear.  No inflammatory or traumatic findings.

Soft tissues: No significant soft tissue injury identified.

CT CERVICAL SPINE FINDINGS

Alignment: No malalignment.

Skull base and vertebrae: No fracture or focal bone lesion.

Soft tissues and spinal canal: Negative

Disc levels: Ordinary osteoarthritis at the C1-2 articulation but
without encroachment upon the neural structures. Mild facet
osteoarthritis on the left at C4-5. Ordinary mild spondylosis at
C5-6 with small endplate osteophytes but no apparent compressive
narrowing of the canal or foramina.

Upper chest: Negative

Other: None
IMPRESSION: Head CT: No acute or traumatic finding.  Normal for age.

Maxillofacial CT: No acute or traumatic finding.

Cervical spine CT: No acute or traumatic finding.  Mild spondylosis.

## 2024-01-25 NOTE — Telephone Encounter (Signed)
-----   Message from Thom Scurry, MD sent at 01/25/2024  2:14 PM EST ----- Hi, please call let her know that I want her to discontinue her glipizide completely since we are increasing her Mounjaro.  Her A1c is 5.9.  Her kidney function, thyroid function and electrolytes  overall look good and there is no significant protein in her urine.

## 2024-01-25 NOTE — Progress Notes (Signed)
 [facility] Patient Clinic Note  Assessment & Plan Hypothyroidism, unspecified type She is not on any pharmacologic therapy for this at this time.  TSH was 3.75, free T4 was 1.07 and free T3 was 3.65 all on 01/25/2024. [ ]  Next thyroid panel due on 07/24/2024. Orders:   TSH; Future   T3, free; Future   T4, free; Future  Hypertension associated with diabetes (CMS-HCC) Just above goal of less than 130/80 secondary to her diabetes.  She is at 130/74 today.  She is on no pharmacologic therapy for this at this time.  Repeat BMP on 01/25/2024 showed no significant abnormalities. [ ]  Next BMP due on 01/24/2025.   Orders:   Basic Metabolic Panel; Future  Type 2 diabetes mellitus without complication, without long-term current use of insulin (CMS-HCC) Controlled based on A1c of 5.9 on 01/25/2024. Patient continues to take current antihyperglycemic regimen of glipizide 5 mg PO BID and Mounjaro 7.5 mg SubQ once weekly.  Last retinal eye exam is unknown. Last foot exam was 07/11/23 and was normal. Last urine albumin to creatinine ratio was normal on 01/25/2024.  Pneumovax was given on 03/16/16.  - Discontinuing glipizide and increasing Mounjaro up to 10 mg subcutaneous once weekly. [ ]  Next A1c due 04/24/2024. [ ]  Next eye exam due at next visit; patient has been counseled previously.  [ ]  Next foot exam due 07/10/24  [ ]  Next urine albumin to creatinine ratio due 01/24/2025.  Orders:   Hemoglobin A1c; Future   Albumin/creatinine urine ratio; Future   tirzepatide (MOUNJARO) 10 mg/0.5 mL PnIj; Inject 0.5 mL (10 mg total) under the skin once a week.   Class 1 obesity with serious comorbidity and body mass index (BMI) of 33.0 to 33.9 in adult, unspecified obesity type She was unwilling to weigh in clinic today and weight at home was reportedly 212 pounds which should be about 4 pounds up since September.  She is currently on Mounjaro 7.5 mg subcutaneous once weekly for concomitant  treatment of her diabetes. -Increasing Mounjaro up to 10 mg subcutaneous once weekly.    Bilateral carpal tunnel syndrome Still bothering her but has not tried night braces.  -Reiterated need to try night braces.       Follow-Up: 3 months  SUBJECTIVE: Chief Complaint  Patient presents with   Diabetes    Monitoring blood sugars at home. Readings have been >130s.  Denies diabetic sx.    Hypothyroidism   Class 1 obesity with serious comorbidity and body mass inde   History of Present Illness Denise Gallagher is a 69 year old female with diabetes who presents for a follow-up visit to check thyroid function and diabetes management.  She is undergoing evaluation of her thyroid function, including TSH, T3, and T4 levels, as well as kidney function and electrolytes. A urine sample will be provided to check for proteinuria due to her diabetes, and her A1c will be assessed.  She is currently on Mounjaro for diabetes management, administered once weekly on Sunday evenings. The medication effectively reduces her appetite from Monday to Wednesday, but by Friday, she experiences increased hunger. She is exploring dietary changes, such as the keto diet and intermittent fasting, to enhance insulin sensitivity. She recalls previous success with fasting and plans to resume it due to frustration with weight fluctuations.  She experiences symptoms of carpal tunnel syndrome, predominantly in her right hand, characterized by pain and a sensation of needing to move her hand to improve  circulation. Her mother has a history of severe carpal tunnel syndrome, likely related to prolonged computer use as an research scientist (medical).  She occasionally experiences swelling in her feet, which she attributes to prolonged standing. She denies excessive salt intake and notes that the swelling is not a regular occurrence.  She is responsible for caring for her granddaughter, which adds stress and limits her ability to  travel or engage in activities outside the school schedule. She is involved in labor-intensive activities and enjoys yard work, which may contribute to her hand symptoms.  I have reviewed the patients problem list, medical, family, and social histories, current medications, and allergies and updated them as needed.  ROS: -As above in HPI and the Assessment and Plan.  Health Maintenance:  Health Maintenance  Topic Date Due   Medicare Annual Wellness Visit (AWV)  Never done   Hepatitis C Screen  Never done   DTaP/Tdap/Td Vaccines (1 - Tdap) Never done   Zoster Vaccines (1 of 2) Never done   Pneumococcal Vaccine 50+ (2 of 2 - PCV) 03/16/2017   Retinal Eye Exam  01/20/2023   COVID-19 Vaccine (3 - 2025-26 season) 09/18/2023   Influenza Vaccine (1) 09/18/2023   Foot Exam  07/10/2024   Hemoglobin A1c  07/24/2024   Mammogram  08/03/2024   Urine Albumin/Creatinine Ratio  01/24/2025   Serum Creatinine Monitoring  01/24/2025   Potassium Monitoring  01/24/2025   DEXA Scan  05/13/2027   Lipid Screening  02/15/2028   Colon Cancer Screening  12/05/2033    OBJECTIVE: Vitals:  Vitals:   01/25/24 0959  BP: 130/74  Pulse: 73  Resp: 18  Temp: 36.7 C (98 F)  SpO2: 98%          10/16/23 94.3 kg (208 lb)    Physical Exam: Constitutional: Well-developed, well-nourished, and in no distress. HENT:  Head: Normocephalic and atraumatic.  Right Ear: External ear normal.  Left Ear: External ear normal.  Nose: Normal on external review.  Mouth: Normal on external review. Eyes: Right eye exhibits no discharge. Left eye exhibits no discharge. No scleral icterus.  Neck: Neck supple.  Cardiovascular: Normal rate.  Pulmonary/Chest: Effort normal. No stridor. No respiratory distress.  Abdominal: No abdominal distension.  Musculoskeletal: General: No deformity.  Neurological: Alert and oriented.  Skin: Skin is dry.  Psychiatric: Mood and affect normal.  Vitals  reviewed.  Rockey Scurry, MD  [facility] [place] [phone]  Telephone [phone]  Fax [phone]

## 2024-01-29 ENCOUNTER — Emergency Department (HOSPITAL_BASED_OUTPATIENT_CLINIC_OR_DEPARTMENT_OTHER)

## 2024-01-29 ENCOUNTER — Encounter (HOSPITAL_BASED_OUTPATIENT_CLINIC_OR_DEPARTMENT_OTHER): Payer: Self-pay | Admitting: Emergency Medicine

## 2024-01-29 ENCOUNTER — Emergency Department (HOSPITAL_BASED_OUTPATIENT_CLINIC_OR_DEPARTMENT_OTHER)
Admission: EM | Admit: 2024-01-29 | Discharge: 2024-01-30 | Disposition: A | Attending: Emergency Medicine | Admitting: Emergency Medicine

## 2024-01-29 ENCOUNTER — Other Ambulatory Visit: Payer: Self-pay

## 2024-01-29 DIAGNOSIS — R079 Chest pain, unspecified: Secondary | ICD-10-CM | POA: Diagnosis present

## 2024-01-29 DIAGNOSIS — Z7984 Long term (current) use of oral hypoglycemic drugs: Secondary | ICD-10-CM | POA: Insufficient documentation

## 2024-01-29 DIAGNOSIS — E119 Type 2 diabetes mellitus without complications: Secondary | ICD-10-CM | POA: Diagnosis not present

## 2024-01-29 DIAGNOSIS — M542 Cervicalgia: Secondary | ICD-10-CM | POA: Insufficient documentation

## 2024-01-29 DIAGNOSIS — R0602 Shortness of breath: Secondary | ICD-10-CM | POA: Diagnosis not present

## 2024-01-29 DIAGNOSIS — J45909 Unspecified asthma, uncomplicated: Secondary | ICD-10-CM | POA: Diagnosis not present

## 2024-01-29 DIAGNOSIS — R0789 Other chest pain: Secondary | ICD-10-CM | POA: Diagnosis not present

## 2024-01-29 DIAGNOSIS — M546 Pain in thoracic spine: Secondary | ICD-10-CM | POA: Diagnosis not present

## 2024-01-29 LAB — CBC WITH DIFFERENTIAL/PLATELET
Abs Immature Granulocytes: 0.03 K/uL (ref 0.00–0.07)
Basophils Absolute: 0.1 K/uL (ref 0.0–0.1)
Basophils Relative: 1 %
Eosinophils Absolute: 0.3 K/uL (ref 0.0–0.5)
Eosinophils Relative: 4 %
HCT: 44.6 % (ref 36.0–46.0)
Hemoglobin: 14.7 g/dL (ref 12.0–15.0)
Immature Granulocytes: 0 %
Lymphocytes Relative: 33 %
Lymphs Abs: 2.9 K/uL (ref 0.7–4.0)
MCH: 28.4 pg (ref 26.0–34.0)
MCHC: 33 g/dL (ref 30.0–36.0)
MCV: 86.1 fL (ref 80.0–100.0)
Monocytes Absolute: 0.8 K/uL (ref 0.1–1.0)
Monocytes Relative: 9 %
Neutro Abs: 4.7 K/uL (ref 1.7–7.7)
Neutrophils Relative %: 53 %
Platelets: 272 K/uL (ref 150–400)
RBC: 5.18 MIL/uL — ABNORMAL HIGH (ref 3.87–5.11)
RDW: 12.9 % (ref 11.5–15.5)
WBC: 8.9 K/uL (ref 4.0–10.5)
nRBC: 0 % (ref 0.0–0.2)

## 2024-01-29 LAB — TROPONIN T, HIGH SENSITIVITY
Troponin T High Sensitivity: <15 ng/L (ref 0–19)
Troponin T High Sensitivity: <15 ng/L (ref 0–19)

## 2024-01-29 LAB — COMPREHENSIVE METABOLIC PANEL WITH GFR
ALT: 17 U/L (ref 0–44)
AST: 20 U/L (ref 15–41)
Albumin: 4.2 g/dL (ref 3.5–5.0)
Alkaline Phosphatase: 100 U/L (ref 38–126)
Anion gap: 12 (ref 5–15)
BUN: 12 mg/dL (ref 8–23)
CO2: 26 mmol/L (ref 22–32)
Calcium: 9.4 mg/dL (ref 8.9–10.3)
Chloride: 101 mmol/L (ref 98–111)
Creatinine, Ser: 0.65 mg/dL (ref 0.44–1.00)
GFR, Estimated: >60 mL/min
Glucose, Bld: 123 mg/dL — ABNORMAL HIGH (ref 70–99)
Potassium: 3.9 mmol/L (ref 3.5–5.1)
Sodium: 138 mmol/L (ref 135–145)
Total Bilirubin: 0.3 mg/dL (ref 0.0–1.2)
Total Protein: 8.1 g/dL (ref 6.5–8.1)

## 2024-01-29 LAB — GLUCOSE, CAPILLARY: Glucose-Capillary: 142 mg/dL — ABNORMAL HIGH (ref 70–99)

## 2024-01-29 MED ORDER — IOHEXOL 350 MG/ML SOLN
60.0000 mL | Freq: Once | INTRAVENOUS | Status: AC | PRN
  Administered 2024-01-29: 60 mL via INTRAVENOUS

## 2024-01-29 MED ORDER — LIDOCAINE 5 % EX PTCH
1.0000 | MEDICATED_PATCH | CUTANEOUS | Status: DC
  Administered 2024-01-29: 1 via TRANSDERMAL
  Filled 2024-01-29: qty 1

## 2024-01-29 MED ORDER — FENTANYL CITRATE (PF) 50 MCG/ML IJ SOSY
50.0000 ug | PREFILLED_SYRINGE | Freq: Once | INTRAMUSCULAR | Status: AC
  Administered 2024-01-29: 50 ug via INTRAVENOUS
  Filled 2024-01-29: qty 1

## 2024-01-29 NOTE — ED Provider Notes (Signed)
 " [place] [facility] AT [facility] Provider Note   CSN: [id number] Arrival date & time: 01/29/24  1726     Patient presents with: Chest Pain   Denise Gallagher is a 69 y.o. female.  {Add pertinent medical, surgical, social history, OB history to YEP:67052}  Chest Pain    69 year old female with medical history significant for DM 2, thyroid disease, asthma presenting to the emergency department with left-sided neck, arm pain, left-sided chest pain.  The patient has had shortness of breath as well as nausea.  Pain is sharp and comes and goes.  She states that it radiates from her left chest around to her back.  She also has been experiencing left-sided neck discomfort that is sharp and intermittent.  She denies any fever or chills, no cough.  Prior to Admission medications  Medication Sig Start Date End Date Taking? Authorizing Provider  albuterol  (PROVENTIL  HFA;VENTOLIN  HFA) 108 (90 Base) MCG/ACT inhaler Inhale 1-2 puffs into the lungs every 6 (six) hours as needed for wheezing or shortness of breath. 02/08/16   [place], [place], [place]  Ascorbic Acid (VITAMIN C CR) 500 MG CPCR Take 500 mg by mouth daily. 03/15/13   [provider]  benzonatate  (TESSALON ) 100 MG capsule Take 1 capsule (100 mg total) by mouth 3 (three) times daily as needed for cough. 02/13/17   Windle Almarie ORN, PA-C  Calcium Citrate-Vitamin D (CALCIUM + D PO) Take 1 tablet by mouth 4 (four) times a week.    [provider]  Cholecalciferol (D 2000) 2000 units TABS Take 2,000 Units by mouth daily. 03/15/13   [provider]  docusate sodium  (COLACE) 100 MG capsule Take 1 capsule (100 mg total) by mouth every 12 (twelve) hours. 12/08/15   Long, Fonda MATSU, MD  HYDROcodone -acetaminophen  (NORCO/VICODIN) 5-325 MG tablet Take 2 tablets by mouth every 4 (four) hours as needed. 12/08/15   Long, Joshua G, MD  levothyroxine (SYNTHROID, LEVOTHROID) 25 MCG tablet Take 25 mcg by mouth  daily before breakfast. 01/07/15   [provider]  lidocaine  (LIDODERM ) 5 % Place 1 patch onto the skin daily. Remove & Discard patch within 12 hours or as directed by MD 02/20/19   Ward, Ami Copes, PA-C  meclizine  (ANTIVERT ) 12.5 MG tablet Take 1 tablet (12.5 mg total) by mouth 3 (three) times daily as needed for dizziness. 03/04/15   Rancour, Garnette, MD  metFORMIN (GLUCOPHAGE-XR) 500 MG 24 hr tablet Take 500 mg by mouth 2 (two) times daily. 01/07/15   [provider]  methocarbamol  (ROBAXIN ) 500 MG tablet Take 1 tablet (500 mg total) by mouth every 8 (eight) hours as needed. 11/21/18   Petrucelli, Samantha R, PA-C  naproxen  (NAPROSYN ) 500 MG tablet Take 1 tablet (500 mg total) by mouth 2 (two) times daily as needed for mild pain or moderate pain. 02/20/19   Ward, Jaime Pilcher, PA-C  omeprazole (PRILOSEC) 40 MG capsule Take 40 mg by mouth daily. 11/27/14   [provider]  ondansetron  (ZOFRAN  ODT) 4 MG disintegrating tablet Take 1 tablet (4 mg total) by mouth every 8 (eight) hours as needed for nausea or vomiting. 02/20/19   Ward, Ami Copes, PA-C  oxyCODONE -acetaminophen  (PERCOCET) 5-325 MG per tablet Take 1-2 tablets by mouth every 6 (six) hours as needed for severe pain. 06/11/14   Hess, Catheryn HERO, PA-C  predniSONE  (DELTASONE ) 10 MG tablet Take 2 tablets (20 mg total) by mouth daily. 02/08/16   Manford Eric Lot, PA  tamsulosin (FLOMAX)  0.4 MG CAPS capsule Take 0.4 mg by mouth daily as needed (for urine flow).  12/25/14   [provider]  traMADol (ULTRAM) 50 MG tablet Take 50 mg by mouth every morning. For cough 02/17/15   [provider]    Allergies: Levofloxacin and Zolpidem tartrate    Review of Systems  Cardiovascular:  Positive for chest pain.    Updated Vital Signs BP 130/67 (BP Location: Right Arm)   Pulse 64   Temp 97.8 F (36.6 C) (Oral)   Resp 16   Ht 5' 4 (1.626 m)   Wt 95.3 kg   SpO2 99%   BMI 36.05 kg/m   Physical  Exam Vitals and nursing note reviewed.  Constitutional:      General: She is not in acute distress.    Appearance: She is well-developed.  HENT:     Head: Normocephalic and atraumatic.  Eyes:     Conjunctiva/sclera: Conjunctivae normal.  Cardiovascular:     Rate and Rhythm: Normal rate and regular rhythm.     Heart sounds: No murmur heard. Pulmonary:     Effort: Pulmonary effort is normal. No respiratory distress.     Breath sounds: Normal breath sounds.  Chest:     Comments: Left sided chest wall TTP that reproduces the patient's pain Abdominal:     Palpations: Abdomen is soft.     Tenderness: There is no abdominal tenderness.  Musculoskeletal:        General: No swelling.     Cervical back: Neck supple.     Comments: Left upper scapular TTP, wrapping around under the patient's axilla, no rash  Skin:    General: Skin is warm and dry.     Capillary Refill: Capillary refill takes less than 2 seconds.  Neurological:     Mental Status: She is alert.  Psychiatric:        Mood and Affect: Mood normal.     (all labs ordered are listed, but only abnormal results are displayed) Labs Reviewed  CBC WITH DIFFERENTIAL/PLATELET - Abnormal; Notable for the following components:      Result Value   RBC 5.18 (*)    All other components within normal limits  COMPREHENSIVE METABOLIC PANEL WITH GFR - Abnormal; Notable for the following components:   Glucose, Bld 123 (*)    All other components within normal limits  GLUCOSE, CAPILLARY - Abnormal; Notable for the following components:   Glucose-Capillary 142 (*)    All other components within normal limits  TROPONIN T, HIGH SENSITIVITY  TROPONIN T, HIGH SENSITIVITY    EKG: EKG Interpretation Date/Time:  Monday January 29 2024 17:34:15 EST Ventricular Rate:  83 PR Interval:  133 QRS Duration:  85 QT Interval:  359 QTC Calculation: 422 R Axis:   13  Text Interpretation: Sinus rhythm Low voltage, precordial leads Baseline wander  in lead(s) V3 Confirmed by Jerrol Agent ([id number]) on 01/29/2024 6:00:43 PM  Radiology: DG Chest 2 View Result Date: 01/29/2024 CLINICAL DATA:  Chest pain. EXAM: CHEST - 2 VIEW COMPARISON:  02/13/2017. FINDINGS: Low lung volumes with bronchovascular crowding. The heart size and mediastinal contours are within normal limits. No focal consolidation, pleural effusion, or pneumothorax. No acute osseous abnormality. IMPRESSION: Low lung volumes.  No acute cardiopulmonary findings. Electronically Signed   By: Harrietta Sherry M.D.   On: 01/29/2024 19:06    {Document cardiac monitor, telemetry assessment procedure when appropriate:32947} Procedures   Medications Ordered in the ED - No data to display    {  Click here for ABCD2, HEART and other calculators REFRESH Note before signing:1}                              Medical Decision Making Amount and/or Complexity of Data Reviewed Labs: ordered. Radiology: ordered.  Risk Prescription drug management.    69 year old female with medical history significant for DM 2, thyroid disease, asthma presenting to the emergency department with left-sided neck, arm pain, left-sided chest pain.  The patient has had shortness of breath as well as nausea.  Pain is sharp and comes and goes.  She states that it radiates from her left chest around to her back.  She also has been experiencing left-sided neck discomfort that is sharp and intermittent.  She denies any fever or chills, no cough.  ***  {Document critical care time when appropriate  Document review of labs and clinical decision tools ie CHADS2VASC2, etc  Document your independent review of radiology images and any outside records  Document your discussion with family members, caretakers and with consultants  Document social determinants of health affecting pt's care  Document your decision making why or why not admission, treatments were needed:32947:::1}   Final diagnoses:  None    ED Discharge  Orders     None        "

## 2024-01-29 NOTE — ED Triage Notes (Addendum)
 Left sided neck and arm pain progressivley getting worse since last night  took some diflucanac last night it helped but pain came back stronger,  has had sob and has  nausea since yesterday , pain  is sharp and comes and goes also has H?A that comes and goes and shoots to neck

## 2024-01-29 NOTE — Telephone Encounter (Signed)
 Patient was informed of results and recommendations per Dr. Alinda Money. Verbalized understanding.

## 2024-01-30 LAB — RESP PANEL BY RT-PCR (RSV, FLU A&B, COVID)  RVPGX2
Influenza A by PCR: NEGATIVE
Influenza B by PCR: NEGATIVE
Resp Syncytial Virus by PCR: NEGATIVE
SARS Coronavirus 2 by RT PCR: NEGATIVE

## 2024-01-30 MED ORDER — KETOROLAC TROMETHAMINE 10 MG PO TABS: 10.0000 mg | ORAL_TABLET | Freq: Four times a day (QID) | ORAL | 0 refills | Status: AC | PRN

## 2024-01-30 MED ORDER — KETOROLAC TROMETHAMINE 15 MG/ML IJ SOLN
15.0000 mg | Freq: Once | INTRAMUSCULAR | Status: AC
  Administered 2024-01-30: 15 mg via INTRAVENOUS
  Filled 2024-01-30: qty 1

## 2024-01-30 NOTE — ED Provider Notes (Incomplete)
 " [place] [facility] AT [facility] Provider Note   CSN: [id number] Arrival date & time: 01/29/24  1726     Patient presents with: Chest Pain   Denise Gallagher is a 69 y.o. female.  {Add pertinent medical, surgical, social history, OB history to YEP:67052}  Chest Pain    69 year old female with medical history significant for DM 2, thyroid disease, asthma presenting to the emergency department with left-sided neck, arm pain, left-sided chest pain.  The patient has had shortness of breath as well as nausea.  Pain is sharp and comes and goes.  She states that it radiates from her left chest around to her back.  She also has been experiencing left-sided neck discomfort that is sharp and intermittent.  She denies any fever or chills, no cough.  Prior to Admission medications  Medication Sig Start Date End Date Taking? Authorizing Provider  albuterol  (PROVENTIL  HFA;VENTOLIN  HFA) 108 (90 Base) MCG/ACT inhaler Inhale 1-2 puffs into the lungs every 6 (six) hours as needed for wheezing or shortness of breath. 02/08/16   [place], [place], [place]  Ascorbic Acid (VITAMIN C CR) 500 MG CPCR Take 500 mg by mouth daily. 03/15/13   [provider]  benzonatate  (TESSALON ) 100 MG capsule Take 1 capsule (100 mg total) by mouth 3 (three) times daily as needed for cough. 02/13/17   Windle Almarie ORN, PA-C  Calcium Citrate-Vitamin D (CALCIUM + D PO) Take 1 tablet by mouth 4 (four) times a week.    [provider]  Cholecalciferol (D 2000) 2000 units TABS Take 2,000 Units by mouth daily. 03/15/13   [provider]  docusate sodium  (COLACE) 100 MG capsule Take 1 capsule (100 mg total) by mouth every 12 (twelve) hours. 12/08/15   Long, Fonda MATSU, MD  HYDROcodone -acetaminophen  (NORCO/VICODIN) 5-325 MG tablet Take 2 tablets by mouth every 4 (four) hours as needed. 12/08/15   Long, Joshua G, MD  levothyroxine (SYNTHROID, LEVOTHROID) 25 MCG tablet Take 25 mcg by mouth  daily before breakfast. 01/07/15   [provider]  lidocaine  (LIDODERM ) 5 % Place 1 patch onto the skin daily. Remove & Discard patch within 12 hours or as directed by MD 02/20/19   Ward, Ami Copes, PA-C  meclizine  (ANTIVERT ) 12.5 MG tablet Take 1 tablet (12.5 mg total) by mouth 3 (three) times daily as needed for dizziness. 03/04/15   Rancour, Garnette, MD  metFORMIN (GLUCOPHAGE-XR) 500 MG 24 hr tablet Take 500 mg by mouth 2 (two) times daily. 01/07/15   [provider]  methocarbamol  (ROBAXIN ) 500 MG tablet Take 1 tablet (500 mg total) by mouth every 8 (eight) hours as needed. 11/21/18   Petrucelli, Samantha R, PA-C  naproxen  (NAPROSYN ) 500 MG tablet Take 1 tablet (500 mg total) by mouth 2 (two) times daily as needed for mild pain or moderate pain. 02/20/19   Ward, Jaime Pilcher, PA-C  omeprazole (PRILOSEC) 40 MG capsule Take 40 mg by mouth daily. 11/27/14   [provider]  ondansetron  (ZOFRAN  ODT) 4 MG disintegrating tablet Take 1 tablet (4 mg total) by mouth every 8 (eight) hours as needed for nausea or vomiting. 02/20/19   Ward, Ami Copes, PA-C  oxyCODONE -acetaminophen  (PERCOCET) 5-325 MG per tablet Take 1-2 tablets by mouth every 6 (six) hours as needed for severe pain. 06/11/14   Hess, Catheryn HERO, PA-C  predniSONE  (DELTASONE ) 10 MG tablet Take 2 tablets (20 mg total) by mouth daily. 02/08/16   Manford Eric Lot, PA  tamsulosin (FLOMAX)  0.4 MG CAPS capsule Take 0.4 mg by mouth daily as needed (for urine flow).  12/25/14   [provider]  traMADol (ULTRAM) 50 MG tablet Take 50 mg by mouth every morning. For cough 02/17/15   [provider]    Allergies: Levofloxacin and Zolpidem tartrate    Review of Systems  Cardiovascular:  Positive for chest pain.    Updated Vital Signs BP 130/67 (BP Location: Right Arm)   Pulse 64   Temp 97.8 F (36.6 C) (Oral)   Resp 16   Ht 5' 4 (1.626 m)   Wt 95.3 kg   SpO2 99%   BMI 36.05 kg/m   Physical  Exam Vitals and nursing note reviewed.  Constitutional:      General: She is not in acute distress.    Appearance: She is well-developed.  HENT:     Head: Normocephalic and atraumatic.  Eyes:     Conjunctiva/sclera: Conjunctivae normal.  Cardiovascular:     Rate and Rhythm: Normal rate and regular rhythm.     Heart sounds: No murmur heard. Pulmonary:     Effort: Pulmonary effort is normal. No respiratory distress.     Breath sounds: Normal breath sounds.  Chest:     Comments: Left sided chest wall TTP that reproduces the patient's pain Abdominal:     Palpations: Abdomen is soft.     Tenderness: There is no abdominal tenderness.  Musculoskeletal:        General: No swelling.     Cervical back: Neck supple.     Comments: Left upper scapular TTP, wrapping around under the patient's axilla, no rash  Skin:    General: Skin is warm and dry.     Capillary Refill: Capillary refill takes less than 2 seconds.  Neurological:     Mental Status: She is alert.  Psychiatric:        Mood and Affect: Mood normal.     (all labs ordered are listed, but only abnormal results are displayed) Labs Reviewed  CBC WITH DIFFERENTIAL/PLATELET - Abnormal; Notable for the following components:      Result Value   RBC 5.18 (*)    All other components within normal limits  COMPREHENSIVE METABOLIC PANEL WITH GFR - Abnormal; Notable for the following components:   Glucose, Bld 123 (*)    All other components within normal limits  GLUCOSE, CAPILLARY - Abnormal; Notable for the following components:   Glucose-Capillary 142 (*)    All other components within normal limits  TROPONIN T, HIGH SENSITIVITY  TROPONIN T, HIGH SENSITIVITY    EKG: EKG Interpretation Date/Time:  Monday January 29 2024 17:34:15 EST Ventricular Rate:  83 PR Interval:  133 QRS Duration:  85 QT Interval:  359 QTC Calculation: 422 R Axis:   13  Text Interpretation: Sinus rhythm Low voltage, precordial leads Baseline wander  in lead(s) V3 Confirmed by Jerrol Agent ([id number]) on 01/29/2024 6:00:43 PM  Radiology: DG Chest 2 View Result Date: 01/29/2024 CLINICAL DATA:  Chest pain. EXAM: CHEST - 2 VIEW COMPARISON:  02/13/2017. FINDINGS: Low lung volumes with bronchovascular crowding. The heart size and mediastinal contours are within normal limits. No focal consolidation, pleural effusion, or pneumothorax. No acute osseous abnormality. IMPRESSION: Low lung volumes.  No acute cardiopulmonary findings. Electronically Signed   By: Harrietta Sherry M.D.   On: 01/29/2024 19:06    {Document cardiac monitor, telemetry assessment procedure when appropriate:32947} Procedures   Medications Ordered in the ED - No data to display    {  Click here for ABCD2, HEART and other calculators REFRESH Note before signing:1}                              Medical Decision Making Amount and/or Complexity of Data Reviewed Labs: ordered. Radiology: ordered.  Risk Prescription drug management.    69 year old female with medical history significant for DM 2, thyroid disease, asthma presenting to the emergency department with left-sided neck, arm pain, left-sided chest pain.  The patient has had shortness of breath as well as nausea.  Pain is sharp and comes and goes.  She states that it radiates from her left chest around to her back.  She also has been experiencing left-sided neck discomfort that is sharp and intermittent.  She denies any fever or chills, no cough.  On arrival, the patient was vitally stable, afebrile, not tachycardic or tachypneic, hypertensive BP 191/81, saturating 90% on room air.  Patient with left-sided chest pain, left neck pain, left back pain.  Pain is sharp and intermittent, worse with twisting and movement, some associated shortness of breath and nausea.  No diaphoresis on arrival.  Patient has reproducible chest wall tenderness to palpation on exam.  She has no rash.  Considered shingles.  Given the chest pain radiating  to the back, considered aortic dissection, less likely infectious process in the neck or blood clot in the neck.  Less likely ACS.  Considered flu, COVID, pneumonia, pneumothorax.    {Document critical care time when appropriate  Document review of labs and clinical decision tools ie CHADS2VASC2, etc  Document your independent review of radiology images and any outside records  Document your discussion with family members, caretakers and with consultants  Document social determinants of health affecting pt's care  Document your decision making why or why not admission, treatments were needed:32947:::1}   Final diagnoses:  None    ED Discharge Orders     None        "

## 2024-01-30 NOTE — Discharge Instructions (Addendum)
 Your workup to include EKG, laboratory evaluation, CT imaging of the chest and neck was overall reassuring.  Your symptoms could be due to musculoskeletal chest wall pain versus developing shingles.  If you develop a rash this would be consistent with shingles.
